# Patient Record
Sex: Male | Born: 1950 | Race: White | Hispanic: No | Marital: Married | Smoking: Never smoker
Health system: Southern US, Community
[De-identification: ages and names within clinical notes are randomized; demographics above are authoritative.]

## PROBLEM LIST (undated history)

## (undated) DIAGNOSIS — K311 Adult hypertrophic pyloric stenosis: Secondary | ICD-10-CM

## (undated) DIAGNOSIS — H348192 Central retinal vein occlusion, unspecified eye, stable: Secondary | ICD-10-CM

## (undated) DIAGNOSIS — K6389 Other specified diseases of intestine: Secondary | ICD-10-CM

## (undated) DIAGNOSIS — K5790 Diverticulosis of intestine, part unspecified, without perforation or abscess without bleeding: Secondary | ICD-10-CM

## (undated) DIAGNOSIS — K4090 Unilateral inguinal hernia, without obstruction or gangrene, not specified as recurrent: Secondary | ICD-10-CM

## (undated) HISTORY — PX: WISDOM TOOTH EXTRACTION: SHX21

## (undated) HISTORY — PX: HERNIA REPAIR: SHX51

## (undated) HISTORY — PX: ABDOMINAL SURGERY: SHX537

## (undated) HISTORY — PX: COLONOSCOPY: SHX174

## (undated) HISTORY — PX: TONSILLECTOMY: SUR1361

---

## 2014-10-03 ENCOUNTER — Emergency Department (HOSPITAL_COMMUNITY)
Admission: EM | Admit: 2014-10-03 | Discharge: 2014-10-03 | Disposition: A | Discharge status: Home / Self Care | Attending: Emergency Medicine | Admitting: Emergency Medicine

## 2014-10-03 ENCOUNTER — Encounter (HOSPITAL_COMMUNITY): Payer: Self-pay | Admitting: Emergency Medicine

## 2014-10-03 DIAGNOSIS — S0181XA Laceration without foreign body of other part of head, initial encounter: Secondary | ICD-10-CM

## 2014-10-03 DIAGNOSIS — Y998 Other external cause status: Secondary | ICD-10-CM | POA: Insufficient documentation

## 2014-10-03 DIAGNOSIS — X58XXXA Exposure to other specified factors, initial encounter: Secondary | ICD-10-CM | POA: Insufficient documentation

## 2014-10-03 DIAGNOSIS — Z8719 Personal history of other diseases of the digestive system: Secondary | ICD-10-CM | POA: Insufficient documentation

## 2014-10-03 DIAGNOSIS — Y9389 Activity, other specified: Secondary | ICD-10-CM | POA: Insufficient documentation

## 2014-10-03 DIAGNOSIS — Z23 Encounter for immunization: Secondary | ICD-10-CM | POA: Diagnosis not present

## 2014-10-03 DIAGNOSIS — Y9289 Other specified places as the place of occurrence of the external cause: Secondary | ICD-10-CM | POA: Diagnosis not present

## 2014-10-03 DIAGNOSIS — S0990XA Unspecified injury of head, initial encounter: Secondary | ICD-10-CM | POA: Diagnosis present

## 2014-10-03 HISTORY — DX: Adult hypertrophic pyloric stenosis: K31.1

## 2014-10-03 MED ORDER — BACITRACIN ZINC 500 UNIT/GM EX OINT
TOPICAL_OINTMENT | CUTANEOUS | Status: AC
  Filled 2014-10-03: qty 0.9

## 2014-10-03 MED ORDER — LIDOCAINE-EPINEPHRINE 1 %-1:100000 IJ SOLN
INTRAMUSCULAR | Status: AC
  Administered 2014-10-03: 1 mL
  Filled 2014-10-03: qty 1

## 2014-10-03 MED ORDER — TETANUS-DIPHTH-ACELL PERTUSSIS 5-2.5-18.5 LF-MCG/0.5 IM SUSP
0.5000 mL | Freq: Once | INTRAMUSCULAR | Status: DC
  Filled 2014-10-03: qty 0.5

## 2014-10-03 NOTE — ED Provider Notes (Signed)
CSN: [id number]     Arrival date & time 10/03/14  9563 History   First MD Initiated Contact with Patient 10/03/14 [phone]     Chief Complaint  Patient presents with  . Head Injury     (Consider location/radiation/quality/duration/timing/severity/associated sxs/prior Treatment) HPI Comments: Patient is a 64 year old male with no significant past medical history. He presents with complaints of forehead laceration. He states that he bent over to pick something up off the floor and at the same time his dog jumped upward and they clashed heads. He sustained a laceration to the forehead just between his eyes. He denies any loss of consciousness, nausea, vomiting, or visual disturbances.  Patient is a 64 y.o. male presenting with head injury. The history is provided by the patient.  Head Injury Head/neck injury location: Forehead. Time since incident:  1 hour Mechanism of injury: direct blow   Pain details:    Quality:  Dull   Duration:  1 hour   Timing:  Constant   Progression:  Unchanged Chronicity:  New Relieved by:  Nothing Worsened by:  Nothing tried Ineffective treatments:  None tried   Past Medical History  Diagnosis Date  . Pyloric stenosis    Past Surgical History  Procedure Laterality Date  . Hernia repair     History reviewed. No pertinent family history. History  Substance Use Topics  . Smoking status: Never Smoker   . Smokeless tobacco: Not on file  . Alcohol Use: 0.6 oz/week    1 Glasses of wine per week     Comment: daily    Review of Systems  All other systems reviewed and are negative.     Allergies  Review of patient's allergies indicates no known allergies.  Home Medications   Prior to Admission medications   Not on File   BP 175/102 mmHg  Pulse 67  Temp(Src) 97.4 F (36.3 C) (Oral)  Resp 16  SpO2 97% Physical Exam  Constitutional: He is oriented to person, place, and time. He appears well-developed and well-nourished.  HENT:  Head:  Normocephalic.  TMs are clear bilaterally.  There is a 1.5 cm laceration oriented diagonally to the forehead between the eyes.  Eyes: EOM are normal. Pupils are equal, round, and reactive to light.  There is no diplopia with upward gaze.  Neck: Normal range of motion. Neck supple.  There is no cervical spine tenderness and he has painless range of motion in all directions.  Lymphadenopathy:    He has no cervical adenopathy.  Neurological: He is alert and oriented to person, place, and time. No cranial nerve deficit. Coordination normal.  Skin: He is not diaphoretic.  Nursing note and vitals reviewed.   ED Course  Procedures (including critical care time) Labs Review Labs Reviewed - No data to display  Imaging Review No results found.   EKG Interpretation None     LACERATION REPAIR Performed by: Veryl Speak Authorized by: Veryl Speak Consent: Verbal consent obtained. Risks and benefits: risks, benefits and alternatives were discussed Consent given by: patient Patient identity confirmed: provided demographic data Prepped and Draped in normal sterile fashion Wound explored  Laceration Location: Forehead  Laceration Length: 1.5 cm  No Foreign Bodies seen or palpated  Anesthesia: local infiltration  Local anesthetic: lidocaine 1% with epinephrine  Anesthetic total: 2 ml  Irrigation method: syringe Amount of cleaning: standard  Skin closure: 6-0 Prolene   Number of sutures: 3   Technique: Simple interrupted   Patient tolerance: Patient tolerated the  procedure well with no immediate complications.   MDM   Final diagnoses:  None    Patient presents with complaints of a forehead laceration after bumping heads with a dog. He is neurologically intact and there was no loss of consciousness. His laceration was repaired and I do not feel as though any imaging studies are warranted. He will be discharged to home with local wound care and suture removal in 5-6  days.    Veryl Speak, MD 10/03/14 [phone]

## 2014-10-03 NOTE — Discharge Instructions (Signed)
Local wound care with bacitracin and dressing changes twice daily.  Sutures are to be removed in 5-6 days, return sooner if the wound develops redness, pus draining from the wound, or other new and concerning symptoms.   Facial Laceration  A facial laceration is a cut on the face. These injuries can be painful and cause bleeding. Lacerations usually heal quickly, but they need special care to reduce scarring. DIAGNOSIS  Your health care provider will take a medical history, ask for details about how the injury occurred, and examine the wound to determine how deep the cut is. TREATMENT  Some facial lacerations may not require closure. Others may not be able to be closed because of an increased risk of infection. The risk of infection and the chance for successful closure will depend on various factors, including the amount of time since the injury occurred. The wound may be cleaned to help prevent infection. If closure is appropriate, pain medicines may be given if needed. Your health care provider will use stitches (sutures), wound glue (adhesive), or skin adhesive strips to repair the laceration. These tools bring the skin edges together to allow for faster healing and a better cosmetic outcome. If needed, you may also be given a tetanus shot. HOME CARE INSTRUCTIONS  Only take over-the-counter or prescription medicines as directed by your health care provider.  Follow your health care provider's instructions for wound care. These instructions will vary depending on the technique used for closing the wound. For Sutures:  Keep the wound clean and dry.   If you were given a bandage (dressing), you should change it at least once a day. Also change the dressing if it becomes wet or dirty, or as directed by your health care provider.   Wash the wound with soap and water 2 times a day. Rinse the wound off with water to remove all soap. Pat the wound dry with a clean towel.   After cleaning,  apply a thin layer of the antibiotic ointment recommended by your health care provider. This will help prevent infection and keep the dressing from sticking.   You may shower as usual after the first 24 hours. Do not soak the wound in water until the sutures are removed.   Get your sutures removed as directed by your health care provider. With facial lacerations, sutures should usually be taken out after 4-5 days to avoid stitch marks.   Wait a few days after your sutures are removed before applying any makeup. For Skin Adhesive Strips:  Keep the wound clean and dry.   Do not get the skin adhesive strips wet. You may bathe carefully, using caution to keep the wound dry.   If the wound gets wet, pat it dry with a clean towel.   Skin adhesive strips will fall off on their own. You may trim the strips as the wound heals. Do not remove skin adhesive strips that are still stuck to the wound. They will fall off in time.  For Wound Adhesive:  You may briefly wet your wound in the shower or bath. Do not soak or scrub the wound. Do not swim. Avoid periods of heavy sweating until the skin adhesive has fallen off on its own. After showering or bathing, gently pat the wound dry with a clean towel.   Do not apply liquid medicine, cream medicine, ointment medicine, or makeup to your wound while the skin adhesive is in place. This may loosen the film before your wound is  healed.   If a dressing is placed over the wound, be careful not to apply tape directly over the skin adhesive. This may cause the adhesive to be pulled off before the wound is healed.   Avoid prolonged exposure to sunlight or tanning lamps while the skin adhesive is in place.  The skin adhesive will usually remain in place for 5-10 days, then naturally fall off the skin. Do not pick at the adhesive film.  After Healing: Once the wound has healed, cover the wound with sunscreen during the day for 1 full year. This can help  minimize scarring. Exposure to ultraviolet light in the first year will darken the scar. It can take 1-2 years for the scar to lose its redness and to heal completely.  SEEK IMMEDIATE MEDICAL CARE IF:  You have redness, pain, or swelling around the wound.   You see ayellowish-white fluid (pus) coming from the wound.   You have chills or a fever.  MAKE SURE YOU:  Understand these instructions.  Will watch your condition.  Will get help right away if you are not doing well or get worse. Document Released: 10/23/2004 Document Revised: 07/06/2013 Document Reviewed: 04/28/2013 [facility] Patient Information 2015 [place], [place]. This information is not intended to replace advice given to you by your health care provider. Make sure you discuss any questions you have with your health care provider.

## 2014-10-03 NOTE — ED Notes (Signed)
Pt states he was reaching over to pick something off the floor and his german shepard was going to jump up on the bed and they hit heads  Pt has a laceration noted to his forehead  Bleeding controlled

## 2017-04-02 DIAGNOSIS — Z8371 Family history of colonic polyps: Secondary | ICD-10-CM | POA: Diagnosis not present

## 2017-04-02 DIAGNOSIS — Z800 Family history of malignant neoplasm of digestive organs: Secondary | ICD-10-CM | POA: Diagnosis not present

## 2017-04-02 DIAGNOSIS — K625 Hemorrhage of anus and rectum: Secondary | ICD-10-CM | POA: Diagnosis not present

## 2017-04-02 DIAGNOSIS — Z1211 Encounter for screening for malignant neoplasm of colon: Secondary | ICD-10-CM | POA: Diagnosis not present

## 2017-04-02 DIAGNOSIS — K5901 Slow transit constipation: Secondary | ICD-10-CM | POA: Diagnosis not present

## 2017-04-16 DIAGNOSIS — H34812 Central retinal vein occlusion, left eye, with macular edema: Secondary | ICD-10-CM | POA: Diagnosis not present

## 2017-04-16 DIAGNOSIS — H269 Unspecified cataract: Secondary | ICD-10-CM | POA: Diagnosis not present

## 2017-04-20 ENCOUNTER — Other Ambulatory Visit: Payer: Self-pay | Admitting: Gastroenterology

## 2017-04-20 DIAGNOSIS — Z1211 Encounter for screening for malignant neoplasm of colon: Secondary | ICD-10-CM | POA: Diagnosis not present

## 2017-04-20 DIAGNOSIS — D124 Benign neoplasm of descending colon: Secondary | ICD-10-CM | POA: Diagnosis not present

## 2017-04-20 DIAGNOSIS — Z800 Family history of malignant neoplasm of digestive organs: Secondary | ICD-10-CM | POA: Diagnosis not present

## 2017-04-20 DIAGNOSIS — K6389 Other specified diseases of intestine: Secondary | ICD-10-CM

## 2017-04-20 DIAGNOSIS — D126 Benign neoplasm of colon, unspecified: Secondary | ICD-10-CM | POA: Diagnosis not present

## 2017-04-20 DIAGNOSIS — D127 Benign neoplasm of rectosigmoid junction: Secondary | ICD-10-CM | POA: Diagnosis not present

## 2017-04-20 DIAGNOSIS — R933 Abnormal findings on diagnostic imaging of other parts of digestive tract: Secondary | ICD-10-CM

## 2017-04-20 DIAGNOSIS — D122 Benign neoplasm of ascending colon: Secondary | ICD-10-CM | POA: Diagnosis not present

## 2017-04-20 DIAGNOSIS — K635 Polyp of colon: Secondary | ICD-10-CM | POA: Diagnosis not present

## 2017-04-20 NOTE — Progress Notes (Signed)
Douglas Preiss MD 

## 2017-04-21 DIAGNOSIS — R972 Elevated prostate specific antigen [PSA]: Secondary | ICD-10-CM | POA: Diagnosis not present

## 2017-04-21 DIAGNOSIS — K639 Disease of intestine, unspecified: Secondary | ICD-10-CM | POA: Diagnosis not present

## 2017-04-21 DIAGNOSIS — N400 Enlarged prostate without lower urinary tract symptoms: Secondary | ICD-10-CM | POA: Diagnosis not present

## 2017-04-22 ENCOUNTER — Ambulatory Visit
Admission: RE | Admit: 2017-04-22 | Discharge: 2017-04-22 | Disposition: A | Discharge status: Home / Self Care | Payer: Medicare Other | Source: Ambulatory Visit | Attending: Gastroenterology | Admitting: Gastroenterology

## 2017-04-22 DIAGNOSIS — R918 Other nonspecific abnormal finding of lung field: Secondary | ICD-10-CM | POA: Diagnosis not present

## 2017-04-22 DIAGNOSIS — R933 Abnormal findings on diagnostic imaging of other parts of digestive tract: Secondary | ICD-10-CM

## 2017-04-22 DIAGNOSIS — K6389 Other specified diseases of intestine: Secondary | ICD-10-CM

## 2017-04-22 DIAGNOSIS — K5900 Constipation, unspecified: Secondary | ICD-10-CM | POA: Diagnosis not present

## 2017-04-22 MED ORDER — IOPAMIDOL (ISOVUE-300) INJECTION 61%
100.0000 mL | Freq: Once | INTRAVENOUS | Status: AC | PRN
  Administered 2017-04-22: 100 mL via INTRAVENOUS

## 2017-05-08 ENCOUNTER — Ambulatory Visit: Payer: Self-pay | Admitting: General Surgery

## 2017-05-08 DIAGNOSIS — D126 Benign neoplasm of colon, unspecified: Secondary | ICD-10-CM | POA: Diagnosis not present

## 2017-05-11 ENCOUNTER — Encounter (HOSPITAL_COMMUNITY): Payer: Self-pay | Admitting: *Deleted

## 2017-05-11 MED ORDER — ALVIMOPAN 12 MG PO CAPS
12.0000 mg | ORAL_CAPSULE | ORAL | Status: AC
  Administered 2017-05-12: 12 mg via ORAL
  Filled 2017-05-11: qty 1

## 2017-05-11 MED ORDER — CEFOTETAN DISODIUM-DEXTROSE 2-2.08 GM-% IV SOLR
2.0000 g | INTRAVENOUS | Status: AC
  Administered 2017-05-12: 2 g via INTRAVENOUS
  Filled 2017-05-11: qty 50

## 2017-05-11 NOTE — Progress Notes (Signed)
SDW-Pre -op call completed by both pt and pt spouse, [place], via speaker phone with pt consent. Pt denies SOB, chest pain, and being under the care of a cardiologist. Pt denies having a cardiac cath and echo. Pt denies having an EKG within the last year. Pt denies recent labs. Pt made aware to drink plenty of clear liquids with prep to avoid dehydration (per MD). Pt made aware to stop taking  Aspirin, vitamins, fish oil and herbal medications. Do not take any NSAIDs ie: Ibuprofen, Advil, Naproxen (Aleve), Motrin, BC and Goody Powder or any medication containing Aspirin. Pt verbalized understanding of all pre-op instructions. Anesthesia asked to review pt history.

## 2017-05-12 ENCOUNTER — Inpatient Hospital Stay (HOSPITAL_COMMUNITY): Payer: Medicare Other

## 2017-05-12 ENCOUNTER — Inpatient Hospital Stay (HOSPITAL_COMMUNITY): Payer: Medicare Other | Admitting: Emergency Medicine

## 2017-05-12 ENCOUNTER — Inpatient Hospital Stay (HOSPITAL_COMMUNITY)
Admission: RE | Admit: 2017-05-12 | Discharge: 2017-05-20 | DRG: 330 | Disposition: A | Discharge status: Home / Self Care | Payer: Medicare Other | Source: Ambulatory Visit | Attending: General Surgery | Admitting: General Surgery

## 2017-05-12 ENCOUNTER — Encounter (HOSPITAL_COMMUNITY)
Admission: RE | Disposition: A | Discharge status: Home / Self Care | Payer: Self-pay | Source: Ambulatory Visit | Attending: General Surgery

## 2017-05-12 ENCOUNTER — Encounter (HOSPITAL_COMMUNITY): Payer: Self-pay | Admitting: Urology

## 2017-05-12 DIAGNOSIS — D123 Benign neoplasm of transverse colon: Principal | ICD-10-CM | POA: Diagnosis present

## 2017-05-12 DIAGNOSIS — R140 Abdominal distension (gaseous): Secondary | ICD-10-CM

## 2017-05-12 DIAGNOSIS — N400 Enlarged prostate without lower urinary tract symptoms: Secondary | ICD-10-CM | POA: Diagnosis present

## 2017-05-12 DIAGNOSIS — I973 Postprocedural hypertension: Secondary | ICD-10-CM | POA: Diagnosis not present

## 2017-05-12 DIAGNOSIS — D127 Benign neoplasm of rectosigmoid junction: Secondary | ICD-10-CM | POA: Diagnosis present

## 2017-05-12 DIAGNOSIS — D128 Benign neoplasm of rectum: Secondary | ICD-10-CM | POA: Diagnosis not present

## 2017-05-12 DIAGNOSIS — Z86010 Personal history of colonic polyps: Secondary | ICD-10-CM | POA: Diagnosis not present

## 2017-05-12 DIAGNOSIS — K4090 Unilateral inguinal hernia, without obstruction or gangrene, not specified as recurrent: Secondary | ICD-10-CM | POA: Diagnosis not present

## 2017-05-12 DIAGNOSIS — K567 Ileus, unspecified: Secondary | ICD-10-CM | POA: Diagnosis not present

## 2017-05-12 DIAGNOSIS — C188 Malignant neoplasm of overlapping sites of colon: Secondary | ICD-10-CM | POA: Diagnosis not present

## 2017-05-12 DIAGNOSIS — C20 Malignant neoplasm of rectum: Secondary | ICD-10-CM | POA: Diagnosis not present

## 2017-05-12 DIAGNOSIS — R1900 Intra-abdominal and pelvic swelling, mass and lump, unspecified site: Secondary | ICD-10-CM | POA: Diagnosis not present

## 2017-05-12 DIAGNOSIS — D126 Benign neoplasm of colon, unspecified: Secondary | ICD-10-CM | POA: Diagnosis present

## 2017-05-12 DIAGNOSIS — Z03818 Encounter for observation for suspected exposure to other biological agents ruled out: Secondary | ICD-10-CM | POA: Diagnosis not present

## 2017-05-12 DIAGNOSIS — C19 Malignant neoplasm of rectosigmoid junction: Secondary | ICD-10-CM | POA: Diagnosis not present

## 2017-05-12 DIAGNOSIS — K6389 Other specified diseases of intestine: Secondary | ICD-10-CM | POA: Diagnosis not present

## 2017-05-12 DIAGNOSIS — Z01818 Encounter for other preprocedural examination: Secondary | ICD-10-CM

## 2017-05-12 HISTORY — DX: Diverticulosis of intestine, part unspecified, without perforation or abscess without bleeding: K57.90

## 2017-05-12 HISTORY — PX: PARTIAL COLECTOMY: SHX5273

## 2017-05-12 HISTORY — DX: Other specified diseases of intestine: K63.89

## 2017-05-12 HISTORY — DX: Central retinal vein occlusion, unspecified eye, stable: H34.8192

## 2017-05-12 HISTORY — PX: PROCTOSCOPY: SHX2266

## 2017-05-12 HISTORY — PX: COLONOSCOPY: SHX5424

## 2017-05-12 HISTORY — DX: Unilateral inguinal hernia, without obstruction or gangrene, not specified as recurrent: K40.90

## 2017-05-12 LAB — COMPREHENSIVE METABOLIC PANEL
ALT: 34 U/L (ref 17–63)
AST: 36 U/L (ref 15–41)
Albumin: 3.8 g/dL (ref 3.5–5.0)
Alkaline Phosphatase: 57 U/L (ref 38–126)
Anion gap: 9 (ref 5–15)
BUN: <5 mg/dL — ABNORMAL LOW (ref 6–20)
CHLORIDE: 107 mmol/L (ref 101–111)
CO2: 25 mmol/L (ref 22–32)
Calcium: 9.1 mg/dL (ref 8.9–10.3)
Creatinine, Ser: 0.83 mg/dL (ref 0.61–1.24)
GFR calc Af Amer: >60 mL/min (ref >60)
GFR calc non Af Amer: >60 mL/min (ref >60)
GLUCOSE: 85 mg/dL (ref 65–99)
POTASSIUM: 3.8 mmol/L (ref 3.5–5.1)
SODIUM: 141 mmol/L (ref 135–145)
Total Bilirubin: 0.7 mg/dL (ref 0.3–1.2)
Total Protein: 6.7 g/dL (ref 6.5–8.1)

## 2017-05-12 LAB — CBC WITH DIFFERENTIAL/PLATELET
BASOS ABS: 0 10*3/uL (ref 0.0–0.1)
Basophils Relative: 0 %
EOS ABS: 0 10*3/uL (ref 0.0–0.7)
Eosinophils Relative: 1 %
HCT: 42.2 % (ref 39.0–52.0)
Hemoglobin: 14.1 g/dL (ref 13.0–17.0)
Lymphocytes Relative: 29 %
Lymphs Abs: 1.1 10*3/uL (ref 0.7–4.0)
MCH: 26 pg (ref 26.0–34.0)
MCHC: 33.4 g/dL (ref 30.0–36.0)
MCV: 77.9 fL — ABNORMAL LOW (ref 78.0–100.0)
MONO ABS: 0.2 10*3/uL (ref 0.1–1.0)
Monocytes Relative: 6 %
NEUTROS ABS: 2.4 10*3/uL (ref 1.7–7.7)
Neutrophils Relative %: 64 %
PLATELETS: 229 10*3/uL (ref 150–400)
RBC: 5.42 MIL/uL (ref 4.22–5.81)
RDW: 13.5 % (ref 11.5–15.5)
WBC: 3.8 10*3/uL — ABNORMAL LOW (ref 4.0–10.5)

## 2017-05-12 LAB — TYPE AND SCREEN
ABO/RH(D): O POS
ANTIBODY SCREEN: NEGATIVE

## 2017-05-12 LAB — ABO/RH: ABO/RH(D): O POS

## 2017-05-12 SURGERY — COLECTOMY, PARTIAL
Anesthesia: General | Site: Rectum

## 2017-05-12 MED ORDER — HYDROMORPHONE HCL 1 MG/ML IJ SOLN
0.2500 mg | INTRAMUSCULAR | Status: DC | PRN
  Administered 2017-05-12: 1 mg via INTRAVENOUS

## 2017-05-12 MED ORDER — ALBUMIN HUMAN 5 % IV SOLN
INTRAVENOUS | Status: DC | PRN
  Administered 2017-05-12 (×2): via INTRAVENOUS

## 2017-05-12 MED ORDER — FENTANYL CITRATE (PF) 100 MCG/2ML IJ SOLN
INTRAMUSCULAR | Status: DC | PRN
  Administered 2017-05-12 (×2): 50 ug via INTRAVENOUS
  Administered 2017-05-12: 200 ug via INTRAVENOUS
  Administered 2017-05-12 (×4): 50 ug via INTRAVENOUS
  Administered 2017-05-12 (×2): 100 ug via INTRAVENOUS
  Administered 2017-05-12: 50 ug via INTRAVENOUS

## 2017-05-12 MED ORDER — SODIUM CHLORIDE 0.9% FLUSH: 9.0000 mL | INTRAVENOUS | Status: DC | PRN

## 2017-05-12 MED ORDER — DEXTROSE 5 % IV SOLN
2.0000 g | Freq: Two times a day (BID) | INTRAVENOUS | Status: AC
  Administered 2017-05-13: 2 g via INTRAVENOUS
  Filled 2017-05-12: qty 2

## 2017-05-12 MED ORDER — FENTANYL CITRATE (PF) 250 MCG/5ML IJ SOLN
INTRAMUSCULAR | Status: AC
  Filled 2017-05-12: qty 5

## 2017-05-12 MED ORDER — DEXAMETHASONE SODIUM PHOSPHATE 10 MG/ML IJ SOLN
INTRAMUSCULAR | Status: AC
  Filled 2017-05-12: qty 1

## 2017-05-12 MED ORDER — ONDANSETRON HCL 4 MG/2ML IJ SOLN: 4.0000 mg | Freq: Four times a day (QID) | INTRAMUSCULAR | Status: DC | PRN

## 2017-05-12 MED ORDER — LACTATED RINGERS IV SOLN
INTRAVENOUS | Status: DC
  Administered 2017-05-12 (×4): via INTRAVENOUS

## 2017-05-12 MED ORDER — ROCURONIUM BROMIDE 10 MG/ML (PF) SYRINGE
PREFILLED_SYRINGE | INTRAVENOUS | Status: AC
  Filled 2017-05-12: qty 5

## 2017-05-12 MED ORDER — DIPHENHYDRAMINE HCL 12.5 MG/5ML PO ELIX: 12.5000 mg | ORAL_SOLUTION | Freq: Four times a day (QID) | ORAL | Status: DC | PRN

## 2017-05-12 MED ORDER — PROMETHAZINE HCL 25 MG/ML IJ SOLN: 6.2500 mg | INTRAMUSCULAR | Status: DC | PRN

## 2017-05-12 MED ORDER — MIDAZOLAM HCL 5 MG/5ML IJ SOLN
INTRAMUSCULAR | Status: DC | PRN
  Administered 2017-05-12: 1 mg via INTRAVENOUS

## 2017-05-12 MED ORDER — HYDROMORPHONE HCL 1 MG/ML IJ SOLN
INTRAMUSCULAR | Status: AC
  Administered 2017-05-12: 1 mg
  Filled 2017-05-12: qty 1

## 2017-05-12 MED ORDER — CHLORHEXIDINE GLUCONATE CLOTH 2 % EX PADS: 6.0000 | MEDICATED_PAD | Freq: Once | CUTANEOUS | Status: DC

## 2017-05-12 MED ORDER — ENOXAPARIN SODIUM 40 MG/0.4ML ~~LOC~~ SOLN
40.0000 mg | SUBCUTANEOUS | Status: DC
  Administered 2017-05-13 – 2017-05-19 (×7): 40 mg via SUBCUTANEOUS
  Filled 2017-05-12 (×8): qty 0.4

## 2017-05-12 MED ORDER — LIDOCAINE 2% (20 MG/ML) 5 ML SYRINGE
INTRAMUSCULAR | Status: AC
  Filled 2017-05-12: qty 5

## 2017-05-12 MED ORDER — KCL IN DEXTROSE-NACL 20-5-0.45 MEQ/L-%-% IV SOLN
INTRAVENOUS | Status: DC
  Administered 2017-05-12 – 2017-05-18 (×8): via INTRAVENOUS
  Filled 2017-05-12 (×14): qty 1000

## 2017-05-12 MED ORDER — POVIDONE-IODINE 10 % EX OINT
TOPICAL_OINTMENT | CUTANEOUS | Status: AC
  Filled 2017-05-12: qty 28.35

## 2017-05-12 MED ORDER — GABAPENTIN 300 MG PO CAPS
300.0000 mg | ORAL_CAPSULE | Freq: Two times a day (BID) | ORAL | Status: DC
  Administered 2017-05-12 – 2017-05-20 (×15): 300 mg via ORAL
  Filled 2017-05-12 (×15): qty 1

## 2017-05-12 MED ORDER — ACETAMINOPHEN 500 MG PO TABS
1000.0000 mg | ORAL_TABLET | Freq: Four times a day (QID) | ORAL | Status: AC
  Administered 2017-05-12 – 2017-05-13 (×4): 1000 mg via ORAL
  Filled 2017-05-12 (×4): qty 2

## 2017-05-12 MED ORDER — PHENYLEPHRINE HCL 10 MG/ML IJ SOLN
INTRAVENOUS | Status: DC | PRN
  Administered 2017-05-12: 30 ug/min via INTRAVENOUS

## 2017-05-12 MED ORDER — MIDAZOLAM HCL 2 MG/2ML IJ SOLN
INTRAMUSCULAR | Status: AC
  Filled 2017-05-12: qty 2

## 2017-05-12 MED ORDER — ONDANSETRON HCL 4 MG/2ML IJ SOLN
INTRAMUSCULAR | Status: AC
  Filled 2017-05-12: qty 2

## 2017-05-12 MED ORDER — ALVIMOPAN 12 MG PO CAPS
12.0000 mg | ORAL_CAPSULE | Freq: Two times a day (BID) | ORAL | Status: DC
  Administered 2017-05-13 – 2017-05-14 (×3): 12 mg via ORAL
  Filled 2017-05-12 (×3): qty 1

## 2017-05-12 MED ORDER — 0.9 % SODIUM CHLORIDE (POUR BTL) OPTIME
TOPICAL | Status: DC | PRN
  Administered 2017-05-12 (×5): 1000 mL

## 2017-05-12 MED ORDER — HYDROMORPHONE 1 MG/ML IV SOLN
INTRAVENOUS | Status: AC
  Filled 2017-05-12: qty 25

## 2017-05-12 MED ORDER — PROPOFOL 10 MG/ML IV BOLUS
INTRAVENOUS | Status: AC
  Filled 2017-05-12: qty 20

## 2017-05-12 MED ORDER — SUGAMMADEX SODIUM 200 MG/2ML IV SOLN
INTRAVENOUS | Status: DC | PRN
  Administered 2017-05-12: 170 mg via INTRAVENOUS

## 2017-05-12 MED ORDER — ROCURONIUM BROMIDE 100 MG/10ML IV SOLN
INTRAVENOUS | Status: DC | PRN
  Administered 2017-05-12 (×2): 50 mg via INTRAVENOUS
  Administered 2017-05-12: 20 mg via INTRAVENOUS
  Administered 2017-05-12: 50 mg via INTRAVENOUS

## 2017-05-12 MED ORDER — PHENYLEPHRINE HCL 10 MG/ML IJ SOLN
INTRAMUSCULAR | Status: DC | PRN
  Administered 2017-05-12: 120 ug via INTRAVENOUS

## 2017-05-12 MED ORDER — LIDOCAINE HCL (CARDIAC) 20 MG/ML IV SOLN
INTRAVENOUS | Status: DC | PRN
  Administered 2017-05-12: 60 mg via INTRAVENOUS

## 2017-05-12 MED ORDER — NALOXONE HCL 0.4 MG/ML IJ SOLN: 0.4000 mg | INTRAMUSCULAR | Status: DC | PRN

## 2017-05-12 MED ORDER — SUGAMMADEX SODIUM 200 MG/2ML IV SOLN
INTRAVENOUS | Status: AC
  Filled 2017-05-12: qty 2

## 2017-05-12 MED ORDER — PROPOFOL 10 MG/ML IV BOLUS
INTRAVENOUS | Status: DC | PRN
  Administered 2017-05-12: 200 mg via INTRAVENOUS

## 2017-05-12 MED ORDER — DEXAMETHASONE SODIUM PHOSPHATE 10 MG/ML IJ SOLN
INTRAMUSCULAR | Status: DC | PRN
  Administered 2017-05-12: 10 mg via INTRAVENOUS

## 2017-05-12 MED ORDER — POVIDONE-IODINE 10 % EX OINT
TOPICAL_OINTMENT | CUTANEOUS | Status: DC | PRN
  Administered 2017-05-12: 1 via TOPICAL

## 2017-05-12 MED ORDER — HYDROMORPHONE 1 MG/ML IV SOLN
INTRAVENOUS | Status: DC
  Administered 2017-05-12: 2.9 mg via INTRAVENOUS
  Administered 2017-05-12: 18:00:00 via INTRAVENOUS
  Administered 2017-05-13: 0.9 mg via INTRAVENOUS
  Administered 2017-05-13: 0.6 mg via INTRAVENOUS

## 2017-05-12 MED ORDER — HYDROMORPHONE HCL 1 MG/ML IJ SOLN
INTRAMUSCULAR | Status: AC
  Filled 2017-05-12: qty 1

## 2017-05-12 MED ORDER — DIPHENHYDRAMINE HCL 50 MG/ML IJ SOLN: 12.5000 mg | Freq: Four times a day (QID) | INTRAMUSCULAR | Status: DC | PRN

## 2017-05-12 SURGICAL SUPPLY — 66 items
BLADE CLIPPER SURG (BLADE) ×10 IMPLANT
BLADE SURG 10 STRL SS (BLADE) ×5 IMPLANT
CANISTER SUCT 3000ML PPV (MISCELLANEOUS) ×5 IMPLANT
CHLORAPREP W/TINT 26ML (MISCELLANEOUS) ×5 IMPLANT
COVER MAYO STAND STRL (DRAPES) ×10 IMPLANT
COVER SURGICAL LIGHT HANDLE (MISCELLANEOUS) ×10 IMPLANT
DRAPE HALF SHEET 40X57 (DRAPES) ×10 IMPLANT
DRAPE LAPAROSCOPIC ABDOMINAL (DRAPES) ×5 IMPLANT
DRAPE UTILITY XL STRL (DRAPES) ×5 IMPLANT
DRAPE WARM FLUID 44X44 (DRAPE) ×5 IMPLANT
DRSG OPSITE POSTOP 4X10 (GAUZE/BANDAGES/DRESSINGS) ×5 IMPLANT
DRSG OPSITE POSTOP 4X6 (GAUZE/BANDAGES/DRESSINGS) ×5 IMPLANT
ELECT BLADE 6.5 EXT (BLADE) ×5 IMPLANT
ELECT CAUTERY BLADE 6.4 (BLADE) ×10 IMPLANT
ELECT REM PT RETURN 9FT ADLT (ELECTROSURGICAL) ×5
ELECTRODE REM PT RTRN 9FT ADLT (ELECTROSURGICAL) ×3 IMPLANT
GLOVE BIO SURGEON STRL SZ7 (GLOVE) ×5 IMPLANT
GLOVE BIOGEL PI IND STRL 6.5 (GLOVE) ×3 IMPLANT
GLOVE BIOGEL PI IND STRL 7.0 (GLOVE) ×3 IMPLANT
GLOVE BIOGEL PI IND STRL 8 (GLOVE) ×9 IMPLANT
GLOVE BIOGEL PI IND STRL 8.5 (GLOVE) ×3 IMPLANT
GLOVE BIOGEL PI INDICATOR 6.5 (GLOVE) ×2
GLOVE BIOGEL PI INDICATOR 7.0 (GLOVE) ×2
GLOVE BIOGEL PI INDICATOR 8 (GLOVE) ×6
GLOVE BIOGEL PI INDICATOR 8.5 (GLOVE) ×2
GLOVE ECLIPSE 7.5 STRL STRAW (GLOVE) ×20 IMPLANT
GLOVE ECLIPSE 8.0 STRL XLNG CF (GLOVE) ×10 IMPLANT
GLOVE EUDERMIC 7 POWDERFREE (GLOVE) ×25 IMPLANT
GLOVE SURG SS PI 6.5 STRL IVOR (GLOVE) ×10 IMPLANT
GOWN STRL REUS W/ TWL LRG LVL3 (GOWN DISPOSABLE) ×15 IMPLANT
GOWN STRL REUS W/ TWL XL LVL3 (GOWN DISPOSABLE) ×18 IMPLANT
GOWN STRL REUS W/TWL LRG LVL3 (GOWN DISPOSABLE) ×10
GOWN STRL REUS W/TWL XL LVL3 (GOWN DISPOSABLE) ×12
KIT BASIN OR (CUSTOM PROCEDURE TRAY) ×5 IMPLANT
KIT ROOM TURNOVER OR (KITS) ×5 IMPLANT
LEGGING LITHOTOMY PAIR STRL (DRAPES) ×5 IMPLANT
LIGASURE IMPACT 36 18CM CVD LR (INSTRUMENTS) ×5 IMPLANT
NS IRRIG 1000ML POUR BTL (IV SOLUTION) ×10 IMPLANT
PACK GENERAL/GYN (CUSTOM PROCEDURE TRAY) ×5 IMPLANT
PAD ARMBOARD 7.5X6 YLW CONV (MISCELLANEOUS) ×5 IMPLANT
PENCIL BUTTON HOLSTER BLD 10FT (ELECTRODE) ×5 IMPLANT
RELOAD PROXIMATE 75MM BLUE (ENDOMECHANICALS) ×25 IMPLANT
SPECIMEN JAR X LARGE (MISCELLANEOUS) ×5 IMPLANT
SPONGE LAP 18X18 X RAY DECT (DISPOSABLE) ×20 IMPLANT
STAPLER CIRC ILS CVD 33MM 37CM (STAPLE) ×5 IMPLANT
STAPLER CUT CVD 40MM BLUE (STAPLE) ×5 IMPLANT
STAPLER GUN LINEAR PROX 60 (STAPLE) ×5 IMPLANT
STAPLER PROXIMATE 75MM BLUE (STAPLE) ×5 IMPLANT
STAPLER VISISTAT 35W (STAPLE) ×5 IMPLANT
SUCTION POOLE TIP (SUCTIONS) ×5 IMPLANT
SURGILUBE 2OZ TUBE FLIPTOP (MISCELLANEOUS) ×5 IMPLANT
SUT PDS AB 1 TP1 96 (SUTURE) ×10 IMPLANT
SUT PROLENE 2 0 CT2 30 (SUTURE) ×5 IMPLANT
SUT PROLENE 2 0 KS (SUTURE) IMPLANT
SUT SILK 2 0 SH CR/8 (SUTURE) ×5 IMPLANT
SUT SILK 2 0 TIES 10X30 (SUTURE) ×5 IMPLANT
SUT SILK 3 0 SH CR/8 (SUTURE) ×5 IMPLANT
SUT SILK 3 0 TIES 10X30 (SUTURE) ×5 IMPLANT
SYR BULB IRRIGATION 50ML (SYRINGE) ×5 IMPLANT
TOWEL OR 17X26 10 PK STRL BLUE (TOWEL DISPOSABLE) ×5 IMPLANT
TRAY FOLEY W/METER SILVER 14FR (SET/KITS/TRAYS/PACK) ×5 IMPLANT
TRAY PROCTOSCOPIC FIBER OPTIC (SET/KITS/TRAYS/PACK) ×10 IMPLANT
TUBE CONNECTING 12'X1/4 (SUCTIONS) ×2
TUBE CONNECTING 12X1/4 (SUCTIONS) ×8 IMPLANT
UNDERPAD 30X30 (UNDERPADS AND DIAPERS) ×5 IMPLANT
YANKAUER SUCT BULB TIP NO VENT (SUCTIONS) ×5 IMPLANT

## 2017-05-12 NOTE — Anesthesia Preprocedure Evaluation (Addendum)
Anesthesia Evaluation  Patient identified by MRN, date of birth, ID band Patient awake    Reviewed: Allergy & Precautions, NPO status , Patient's Chart, lab work & pertinent test results  Airway Mallampati: II  TM Distance: >3 FB Neck ROM: Full    Dental  (+) Dental Advisory Given   Pulmonary neg pulmonary ROS,    breath sounds clear to auscultation       Cardiovascular negative cardio ROS   Rhythm:Regular Rate:Normal     Neuro/Psych negative neurological ROS     GI/Hepatic Neg liver ROS, Colon mass   Endo/Other  negative endocrine ROS  Renal/GU negative Renal ROS     Musculoskeletal   Abdominal   Peds  Hematology negative hematology ROS (+)   Anesthesia Other Findings   Reproductive/Obstetrics                             Lab Results  Component Value Date   WBC 3.8 (L) 05/12/2017   HGB 14.1 05/12/2017   HCT 42.2 05/12/2017   MCV 77.9 (L) 05/12/2017   PLT 229 05/12/2017   Lab Results  Component Value Date   CREATININE 0.83 05/12/2017   BUN <5 (L) 05/12/2017   NA 141 05/12/2017   K 3.8 05/12/2017   CL 107 05/12/2017   CO2 25 05/12/2017    Anesthesia Physical Anesthesia Plan  ASA: II  Anesthesia Plan: General   Post-op Pain Management:    Induction: Intravenous  PONV Risk Score and Plan: 4 or greater and Ondansetron, Dexamethasone, Treatment may vary due to age or medical condition, Scopolamine patch - Pre-op and Midazolam  Airway Management Planned: Oral ETT  Additional Equipment:   Intra-op Plan:   Post-operative Plan: Extubation in OR  Informed Consent: I have reviewed the patients History and Physical, chart, labs and discussed the procedure including the risks, benefits and alternatives for the proposed anesthesia with the patient or authorized representative who has indicated his/her understanding and acceptance.   Dental advisory given  Plan Discussed  with: CRNA  Anesthesia Plan Comments:        Anesthesia Quick Evaluation

## 2017-05-12 NOTE — Anesthesia Procedure Notes (Signed)
Procedure Name: Intubation Date/Time: 05/12/2017 1:43 PM Performed by: Izora Gala Pre-anesthesia Checklist: Patient identified, Emergency Drugs available and Suction available Patient Re-evaluated:Patient Re-evaluated prior to induction Oxygen Delivery Method: Circle system utilized Preoxygenation: Pre-oxygenation with 100% oxygen Induction Type: IV induction Ventilation: Mask ventilation without difficulty Laryngoscope Size: Miller and 3 Grade View: Grade II Tube type: Oral Tube size: 7.5 mm Number of attempts: 1 Airway Equipment and Method: Stylet Placement Confirmation: ETT inserted through vocal cords under direct vision,  positive ETCO2 and breath sounds checked- equal and bilateral Secured at: 22 cm Tube secured with: Tape Dental Injury: Teeth and Oropharynx as per pre-operative assessment

## 2017-05-12 NOTE — Op Note (Signed)
OPERATIVE REPORT  DATE OF OPERATION: 05/12/2017  PATIENT:  Douglas Warren  66 y.o. 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  FINDINGS:  Tumor #1 in the proximal rectum at 13 cm from the anal verge; tumor #2 was in the proximal right transverse colon, just distal to the hepatic flexure.  No evidence of hepatic disease or metastatic spread.  Frozen section of the right colon lesion showed only evidence of a tubulovillous adenoma  PROCEDURE:  Procedure(s): LEFT COLECTOMY PARTIAL RIGHT TRANSVERSE COLECTOMY COLONOSCOPY PROCTOSCOPY  SURGEON:  Surgeon(s): Judeth Horn, MD Fanny Skates, MD Carol Ada, MD  ASSISTANT: Dalbert Batman (primary) and Dr. Benson Norway  ANESTHESIA:   general  COMPLICATIONS:  None  EBL: 100 ml  BLOOD ADMINISTERED: none  DRAINS: Urinary Catheter (Foley)   SPECIMEN:  Source of Specimen:  Descending colon and rectosigmoid 43 cm segment; 8 cm right transvers colon specimen with tumor #2.; extended proximal and distal margins fro tumor #2; proximal and distal anastomotic rings from EEA anastomosis  COUNTS CORRECT:  YES  PROCEDURE DETAILS: The patient was taken to the operating room and placed on the table initially in the supine position. After an adequate general endotracheal anesthetic was administered, he was placed in the lithotomy position, a proper timeout was performed identifying the patient and procedure to be performed, and an initial proctoscopy was performed.  The rigid sigmoidoscope was passed transanally into the patient's rectum and passed all the way to the hub of the scope a 22 cm.  At approximately 13  cm a friable tumor was seen which bled easily. We retrieved the proctoscope then subsequently prepped the patient's perineum and perirectal area, and also his abdomen in the usual sterile manner keeping him available for the subsequent transrectal anastomosis.  A second timeout was performed identifying the patient and procedure to be performed. A midline incision was made using a #10 blade and we started just above the umbilicus but it ended up extending proximally because of the need to take down the splenic flexure and identify the second primary tumor.  We entered the peritoneal cavity and then subsequently explored the abdomen manually and visually. The rectosigmoid tumor could be palpated in the proximal rectum. However, the second primary tumor could not be palpated within the descending colon on the left side. We mobilized the splenic flexure easily without injuring the spleen taking it all the way to the mid transverse colon and could not palpate a second tumor. We did palpate the cecum and the right colon and could not palpate a tumor in that area either. The reported tumor location was on the descending colon side so we went to the proximal descending colon, came across the colon with a GIA-75 stapler, then took down the colon all the way down to the rectum below the level of the palpable rectosigmoid tumor. Care was taken to identify and protect the left ureter, and this was done.  It was clearly identified crossing the left iliac artery and kept away from the area of resection.    The mesentery was taken with Kelly clamps and also a LigaSure device. We used the proximal portion of the colon and made a colotomy within a pursestring  suture, in order to pass a rigid sigmoidoscope proximally and distally and could not identify a second tumor up to the mid to right transverse colon. We also cannot find a tumor in the specimen that was taken off the field and subsequently open up. The only tumor that  was identified in the specimen was in the proximal rectum.  Because of these concerns and the patient having a known second primary tumor the gastroenterologist was requested to come colonoscope the patient through the open end of the colon in the operating room. This was done and a second tumor was identified just distal to the hepatic flexure on right side. This was marked with the colonoscope in place with a suture and subsequently resection of approximately 8 cm of colon done which included the primary tumor. We resected more margin proximally and distally just to make sure we had adequate margins and sent the specimen for frozen section which only identifiable was appeared to be a tubulovillous adenoma.  An anastomosis between the transverse colon and the hepatic flexure was done using a GIA-75 stapler. The expected enterotomy was closed using a TX 60 stapler. The only anastomosis left was between the proximal descending colon and the mobilized splenic flexure down to the rectum. This was performed using a premium 33 mm EEA stapler. The proximal descending colon had a pursestring placed around it which allowed the anvil to be placed and secured in place for the anastomosis. The surgeon went through the patient's rectum with the stapling device then attached to the anvil of the proximal descending colon. An anastomosis was performed by firing the stapling device. We subsequently inspected the anastomosis with a sigmoidoscope and also perform an air test with saline in the pelvis and those no evidence of leakage.  We placed a corner stitch on the proximal colon anastomosis where the right transverse colon was removed. The assistant then irrigated the pelvis and the abdomen with saline solution as the primary surgeon changed gowns and gloves for closure. Once this was done we inspected for adequate hemostasis and there was minimal bleeding. We closed the abdomen using running looped #1 PDS suture. The skin  was closed using a stainless steel staple. All needle counts, sponge counts, and instrument counts were correct. A sterile dressing was applied including Betadine ointment and a honeycomb dressing.  PATIENT DISPOSITION:  PACU - hemodynamically stable.   Maryori Weide 8/14/20185:42 PM

## 2017-05-12 NOTE — H&P (Signed)
[address] 05/08/2017 2:05 PM Location: [facility] Patient #: [id number] DOB: December 16, 1950 Married / Language: Cleophus Molt / Race: White Male   History of Present Illness Douglas Warren. Aleksi Brummet MD; 05/08/2017 2:41 PM) The patient is a 66 year old male who presents with a colonic mass. The patient was previously evaluated by a gastroenterologist (Dr. Collene Mares) 3 week(s) ago. Previous presentation included no symptoms (found incidentally with testing). The mass was discovered by colonoscopy. Past evaluation has included genetic evaluation (Cologuard). Past treatment has included none (Biopsies only. No attempt at resection colonoscopically). No changes in management were made at the last visit.   Past Surgical History Malachy Moan, [place]; 05/08/2017 2:05 PM) Colon Polyp Removal - Colonoscopy  Open Inguinal Hernia Surgery  Left. Oral Surgery  Tonsillectomy   Diagnostic Studies History Malachy Moan, [place]; 05/08/2017 2:05 PM) Colonoscopy  within last year  Allergies Malachy Moan, RMA; 05/08/2017 2:05 PM) No Known Allergies 05/08/2017  Medication History Malachy Moan, [place]; 05/08/2017 2:05 PM) No Current Medications Medications Reconciled  Social History Malachy Moan, [place]; 05/08/2017 2:05 PM) Alcohol use  Occasional alcohol use. Caffeine use  Coffee. No drug use  Tobacco use  Never smoker.  Family History Malachy Moan, [place]; 05/08/2017 2:05 PM) Arthritis  Mother. Cancer  Father. Colon Cancer  Sister. Colon Polyps  Sister. Prostate Cancer  Father. Respiratory Condition  Mother.  Other Problems Malachy Moan, [place]; 05/08/2017 2:05 PM) Enlarged Prostate  Hemorrhoids  Inguinal Hernia  Other disease, cancer, significant illness     Review of Systems Malachy Moan RMA; 05/08/2017 2:05 PM) General Present- Night Sweats. Not Present- Appetite Loss, Chills, Fatigue, Fever, Weight Gain and Weight Loss. Skin Not Present- Change in  Wart/Mole, Dryness, Hives, Jaundice, New Lesions, Non-Healing Wounds, Rash and Ulcer. HEENT Present- Visual Disturbances. Not Present- Earache, Hearing Loss, Hoarseness, Nose Bleed, Oral Ulcers, Ringing in the Ears, Seasonal Allergies, Sinus Pain, Sore Throat, Wears glasses/contact lenses and Yellow Eyes. Respiratory Not Present- Bloody sputum, Chronic Cough, Difficulty Breathing, Snoring and Wheezing. Breast Not Present- Breast Mass, Breast Pain, Nipple Discharge and Skin Changes. Cardiovascular Not Present- Chest Pain, Difficulty Breathing Lying Down, Leg Cramps, Palpitations, Rapid Heart Rate, Shortness of Breath and Swelling of Extremities. Gastrointestinal Present- Constipation and Hemorrhoids. Not Present- Abdominal Pain, Bloating, Bloody Stool, Change in Bowel Habits, Chronic diarrhea, Difficulty Swallowing, Excessive gas, Gets full quickly at meals, Indigestion, Nausea, Rectal Pain and Vomiting. Male Genitourinary Present- Impotence. Not Present- Blood in Urine, Change in Urinary Stream, Frequency, Nocturia, Painful Urination, Urgency and Urine Leakage. Musculoskeletal Not Present- Back Pain, Joint Pain, Joint Stiffness, Muscle Pain, Muscle Weakness and Swelling of Extremities. Neurological Not Present- Decreased Memory, Fainting, Headaches, Numbness, Seizures, Tingling, Tremor, Trouble walking and Weakness. Psychiatric Not Present- Anxiety, Bipolar, Change in Sleep Pattern, Depression, Fearful and Frequent crying. Endocrine Not Present- Cold Intolerance, Excessive Hunger, Hair Changes, Heat Intolerance, Hot flashes and New Diabetes. Hematology Not Present- Blood Thinners, Easy Bruising, Excessive bleeding, Gland problems, HIV and Persistent Infections.  Vitals Malachy Moan RMA; 05/08/2017 2:06 PM) 05/08/2017 2:05 PM Weight: 183 lb Height: 9in Body Surface Area: 0.45 m Body Mass Index: 1588.42 kg/m  Temp.: 97.3F  Pulse: 74 (Regular)  BP: 120/84 (Sitting, Left Arm,  Standard) 114/85, P 83.  Hgb 14.1      Physical Exam (Callan Yontz O. Hulen Skains MD; 05/08/2017 2:47 PM) General Mental Status-Alert. General Appearance-Well groomed(Looks younger than stated age). Orientation-Oriented X4. Build & Nutrition-Lean. Voice-Normal.  Head and Neck Neck -Note: No bruits. No adenopathy.  Chest and Lung Exam Chest and lung exam reveals -normal excursion with symmetric chest walls, non-tender and normal tactile fremitus and on auscultation, normal breath sounds, no adventitious sounds and normal vocal resonance.  Cardiovascular Cardiovascular examination reveals -normal heart sounds, regular rate and rhythm with no murmurs and normal pedal pulses bilaterally.  Abdomen Inspection Hernias - Inguinal hernia - Right - Reducible(Seems to be direct). Palpation/Percussion Palpation and Percussion of the abdomen reveal - Soft, Non Tender, No Rigidity (guarding) and No hepatosplenomegaly. Auscultation Auscultation of the abdomen reveals - Bowel sounds normal. Note: Good bowel sounds, no masses. Nonpalpable liver or spleen. CT without evidence of metastatic disease. No bleeding or problems with the bowel prep     Assessment & Plan Douglas Warren. 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 Questiion if the patient was tattooed. One is at the rectosigmoid juction and one at the descending colon. Will perform left colectomy distal to the splenic flexure if possible. Current Plans: Left colectomy , possible hemicolectomy.  No evidence of cancer on initial pathology  I spoke with the GI MD and the tumor site was not stained, but because of its size it should be easily palpated.    Will be done in the lithotomy position.  He did receive his Entereg prior to OR.  Douglas Warren. Dahlia Bailiff, MD,  [place] [phone] [facility]

## 2017-05-12 NOTE — Transfer of Care (Signed)
Immediate Anesthesia Transfer of Care Note  Patient: Douglas Warren  Procedure(s) Performed: Procedure(s): LEFT COLECTOMY (Left) COLONOSCOPY (N/A) PROCTOSCOPY (N/A)  Patient Location: PACU  Anesthesia Type:General  Level of Consciousness: awake, alert  and patient cooperative  Airway & Oxygen Therapy: Patient Spontanous Breathing  Post-op Assessment: Report given to RN and Post -op Vital signs reviewed and stable  Post vital signs: Reviewed and stable  Last Vitals:  Vitals:   05/12/17 1043  BP: 114/86  Pulse: 83  Resp: 18  Temp: (!) 36.4 C  SpO2: 96%    Last Pain:  Vitals:   05/12/17 1043  TempSrc: Oral      Patients Stated Pain Goal: 5 (16/38/46 6599)  Complications: No apparent anesthesia complications

## 2017-05-13 ENCOUNTER — Encounter (HOSPITAL_COMMUNITY): Payer: Self-pay | Admitting: General Surgery

## 2017-05-13 LAB — CBC
HCT: 38.3 % — ABNORMAL LOW (ref 39.0–52.0)
Hemoglobin: 12.6 g/dL — ABNORMAL LOW (ref 13.0–17.0)
MCH: 26.3 pg (ref 26.0–34.0)
MCHC: 32.9 g/dL (ref 30.0–36.0)
MCV: 79.8 fL (ref 78.0–100.0)
PLATELETS: 228 10*3/uL (ref 150–400)
RBC: 4.8 MIL/uL (ref 4.22–5.81)
RDW: 13.7 % (ref 11.5–15.5)
WBC: 12.1 10*3/uL — AB (ref 4.0–10.5)

## 2017-05-13 LAB — HEMOGLOBIN A1C
HEMOGLOBIN A1C: 5.3 % (ref 4.8–5.6)
Mean Plasma Glucose: 105 mg/dL

## 2017-05-13 LAB — BASIC METABOLIC PANEL
Anion gap: 9 (ref 5–15)
CHLORIDE: 102 mmol/L (ref 101–111)
CO2: 28 mmol/L (ref 22–32)
CREATININE: 1.16 mg/dL (ref 0.61–1.24)
Calcium: 8.8 mg/dL — ABNORMAL LOW (ref 8.9–10.3)
GFR calc Af Amer: >60 mL/min (ref >60)
Glucose, Bld: 221 mg/dL — ABNORMAL HIGH (ref 65–99)
Potassium: 4.4 mmol/L (ref 3.5–5.1)
SODIUM: 139 mmol/L (ref 135–145)

## 2017-05-13 MED ORDER — ONDANSETRON HCL 4 MG/2ML IJ SOLN
4.0000 mg | Freq: Four times a day (QID) | INTRAMUSCULAR | Status: DC | PRN
  Administered 2017-05-14: 4 mg via INTRAVENOUS
  Filled 2017-05-13: qty 2

## 2017-05-13 MED ORDER — NALOXONE HCL 0.4 MG/ML IJ SOLN: 0.4000 mg | INTRAMUSCULAR | Status: DC | PRN

## 2017-05-13 MED ORDER — SODIUM CHLORIDE 0.9% FLUSH: 9.0000 mL | INTRAVENOUS | Status: DC | PRN

## 2017-05-13 MED ORDER — HYDROMORPHONE 1 MG/ML IV SOLN
INTRAVENOUS | Status: DC
  Administered 2017-05-14: 1 mg via INTRAVENOUS
  Administered 2017-05-14: 0.2 mg via INTRAVENOUS
  Administered 2017-05-14: 0.8 mg via INTRAVENOUS

## 2017-05-13 MED ORDER — DIPHENHYDRAMINE HCL 50 MG/ML IJ SOLN: 12.5000 mg | Freq: Four times a day (QID) | INTRAMUSCULAR | Status: DC | PRN

## 2017-05-13 MED ORDER — DIPHENHYDRAMINE HCL 12.5 MG/5ML PO ELIX: 12.5000 mg | ORAL_SOLUTION | Freq: Four times a day (QID) | ORAL | Status: DC | PRN

## 2017-05-13 NOTE — Op Note (Signed)
[facility] Patient Name: Douglas Warren Procedure Date : 05/12/2017 MRN: [id number] Attending MD: Carol Ada , MD Date of Birth: 1951/09/12 CSN: [id number] Age: 66 Admit Type: Inpatient Procedure:                Colonoscopy Indications:              High risk colon cancer surveillance: Personal                            history of colonic polyps Providers:                Carol Ada, MD, Corliss Parish, Technician Referring MD:              Medicines:                General Anesthesia Complications:            No immediate complications. Estimated Blood Loss:     Estimated blood loss: none. Procedure:                Pre-Anesthesia Assessment:                           - Prior to the procedure, a History and Physical                            was performed, and patient medications and                            allergies were reviewed. The patient's tolerance of                            previous anesthesia was also reviewed. The risks                            and benefits of the procedure and the sedation                            options and risks were discussed with the patient.                            All questions were answered, and informed consent                            was obtained. Prior Anticoagulants: The patient has                            taken no previous anticoagulant or antiplatelet                            agents. ASA Grade Assessment: II - A patient with                            mild systemic disease. After reviewing the risks  and benefits, the patient was deemed in                            satisfactory condition to undergo the procedure.                           - Sedation was administered by an anesthesia                            professional. General anesthesia was attained.                           After obtaining informed consent, the colonoscope                            was passed under  direct vision. Throughout the                            procedure, the patient's blood pressure, pulse, and                            oxygen saturations were monitored continuously. The                            EC-[id number] ([id number]) scope was introduced through                            the descending colostomy and advanced to the the                            cecum, identified by appendiceal orifice and                            ileocecal valve. The colonoscopy was performed                            without difficulty. The patient tolerated the                            procedure well. The quality of the bowel                            preparation was good. The ileocecal valve and the                            appendiceal orifice were photographed. Scope In: Scope Out: Findings:      A 30-50 mm polyp was found in the mid transverse colon. The polyp was       sessile.      The patient was identified to have a secondary polyp which was       carpeting. At the time of the resection, Drs. Hulen Skains and Dalbert Batman were not       able to find the secondary polyp as described by Dr. Collene Mares with the prior       colonoscopy. The  rectosigmoid polypoid mass was able to be resected and       rigid endoscopy by Dr. Hulen Skains was unable to localize the secondary polyp.       GI assistance was required and the colonoscope was inserted through a       purse string ostomy at the splenic flexure. Orientation was difficult,       but it appears that the carpeting polyp was in the mid transverse colon.       The lesion was diffuse, circumfirential, and estimated to be 3-5 cm in       length. Externally Dr. Hulen Skains was able to localize the lesion and       continue with surgical management. Impression:               - One 50 mm polyp in the mid transverse colon.                           - No specimens collected. Moderate Sedation:      None Recommendation:           - Resection per Drs. Hulen Skains and Dalbert Batman.                            - Make the patient NPO starting today.                           - Continue present medications.                           - Patient has a contact number available for                            emergencies. The signs and symptoms of potential                            delayed complications were discussed with the                            patient. Return to normal activities tomorrow.                            Written discharge instructions were provided to the                            patient.                           - Repeat colonoscopy in 1 year for surveillance. Procedure Code(s):        --- Professional ---                           [phone], Colonoscopy through stoma; diagnostic,                            including collection of specimen(s) by brushing or  washing, when performed (separate procedure) Diagnosis Code(s):        --- Professional ---                           D12.3, Benign neoplasm of transverse colon (hepatic                            flexure or splenic flexure)                           Z86.010, Personal history of colonic polyps CPT copyright 2016 American Medical Association. All rights reserved. The codes documented in this report are preliminary and upon coder review may  be revised to meet current compliance requirements. Carol Ada, MD Carol Ada, MD 05/12/2017 4:18:35 PM This report has been signed electronically. Number of Addenda: 0

## 2017-05-13 NOTE — Progress Notes (Signed)
Spoke with MD Hulen Skains and let him know patient wasn't using much of the PCA and when he used it this morning patient got drowsy and slept for 3 hours. I asked if we could switch it to something not as strong and he said we could change the dilaudid full dose to dilaudid reduced dose PCA. Will adjust orders and continue to monitor.

## 2017-05-13 NOTE — Evaluation (Signed)
Physical Therapy Evaluation Patient Details Name: Douglas Warren MRN: [id number] DOB: 15-Feb-1951 Today's Date: 05/13/2017   History of Present Illness  The patient is a 66 year old male who presents with a colonic mass. Now s/p L colectomy, partial R transverse colectomy and colonoscopy 05/12/17.  Clinical Impression  Patient presents with decreased mobility due to pain post surgery.  Feel he will benefit from skilled PT in the acute setting to allow return home with family support.  Likely not to need follow up PT at d/c.     Follow Up Recommendations No PT follow up    Equipment Recommendations  None recommended by PT    Recommendations for Other Services       Precautions / Restrictions Precautions Precautions: None Restrictions Weight Bearing Restrictions: No      Mobility  Bed Mobility Overal bed mobility: Needs Assistance Bed Mobility: Supine to Sit     Supine to sit: HOB elevated;Supervision     General bed mobility comments: S and educ on log roll technique w/ HOB elevated to ~20'; increased time secondary to pain.  Transfers Overall transfer level: Needs assistance Equipment used: Rolling walker (2 wheeled) Transfers: Sit to/from Stand Sit to Stand: From elevated surface;Min guard         General transfer comment: Min guard for balance and cues for hand placement with RW. Cues for breathing technique secondary to pain.   Ambulation/Gait Ambulation/Gait assistance: Min guard;Supervision Ambulation Distance (Feet): 140 Feet Assistive device: Rolling walker (2 wheeled) Gait Pattern/deviations: Step-through pattern;Decreased stride length;Trunk flexed     General Gait Details: cues to straighten up with able, pt burping throughout  Stairs            Wheelchair Mobility    Modified Rankin (Stroke Patients Only)       Balance Overall balance assessment: Needs assistance Sitting-balance support: No upper extremity supported;Feet  supported Sitting balance-Leahy Scale: Good     Standing balance support: Bilateral upper extremity supported;During functional activity Standing balance-Leahy Scale: Fair                               Pertinent Vitals/Pain Pain Assessment: 0-10 Pain Score: 5  Pain Location: L stomach Pain Descriptors / Indicators: Discomfort;Guarding;Sore Pain Intervention(s): Limited activity within patient's tolerance;Monitored during session;Repositioned    Home Living Family/patient expects to be discharged to:: Private residence Living Arrangements: Spouse/significant other Available Help at Discharge: Family;Available 24 hours/day Type of Home: House Home Access: Stairs to enter Entrance Stairs-Rails: Left Entrance Stairs-Number of Steps: 2 Home Layout: One level Home Equipment: Cane - single point;Hand held shower head;Grab bars - tub/shower      Prior Function Level of Independence: Independent         Comments: Pt works as a Lawyer        Extremity/Trunk Assessment   Upper Extremity Assessment Upper Extremity Assessment: Overall WFL for tasks assessed    Lower Extremity Assessment Lower Extremity Assessment: Overall WFL for tasks assessed       Communication   Communication: No difficulties  Cognition Arousal/Alertness: Awake/alert Behavior During Therapy: WFL for tasks assessed/performed Overall Cognitive Status: Within Functional Limits for tasks assessed  General Comments General comments (skin integrity, edema, etc.): son present throughout session; wife arrived during tx    Exercises     Assessment/Plan    PT Assessment Patient needs continued PT services  PT Problem List Decreased mobility;Decreased strength;Decreased activity tolerance;Decreased balance;Decreased knowledge of use of DME;Pain       PT Treatment Interventions DME instruction;Gait  training;Stair training;Balance training;Functional mobility training;Patient/family education;Therapeutic exercise    PT Goals (Current goals can be found in the Care Plan section)  Acute Rehab PT Goals Patient Stated Goal: to return to independent PT Goal Formulation: With patient Time For Goal Achievement: 05/20/17 Potential to Achieve Goals: Good    Frequency Min 3X/week   Barriers to discharge        Co-evaluation               AM-PAC PT "6 Clicks" Daily Activity  Outcome Measure Difficulty turning over in bed (including adjusting bedclothes, sheets and blankets)?: A Little Difficulty moving from lying on back to sitting on the side of the bed? : A Little Difficulty sitting down on and standing up from a chair with arms (e.g., wheelchair, bedside commode, etc,.)?: Total Help needed moving to and from a bed to chair (including a wheelchair)?: A Little Help needed walking in hospital room?: A Little Help needed climbing 3-5 steps with a railing? : A Little 6 Click Score: 16    End of Session Equipment Utilized During Treatment: Gait belt Activity Tolerance: Patient tolerated treatment well Patient left: in chair;with call bell/phone within reach;with family/visitor present   PT Visit Diagnosis: Difficulty in walking, not elsewhere classified (R26.2)    Time: 5621-3086 PT Time Calculation (min) (ACUTE ONLY): 33 min   Charges:   PT Evaluation $PT Eval Low Complexity: 1 Low PT Treatments $Gait Training: 8-22 mins   PT G CodesMagda Kiel, [place] 05/13/2017   Reginia Naas 05/13/2017, 5:16 PM

## 2017-05-13 NOTE — Progress Notes (Signed)
Inpatient Diabetes Program Recommendations  AACE/ADA: New Consensus Statement on Inpatient Glycemic Control (2015)  Target Ranges:  Prepandial:   less than 140 mg/dL      Peak postprandial:   less than 180 mg/dL (1-2 hours)      Critically ill patients:  140 - 180 mg/dL   Lab Results  Component Value Date   HGBA1C 5.3 05/12/2017    Review of Glycemic Control  Diabetes history: None Current orders for Inpatient glycemic control: None  Inpatient Diabetes Program Recommendations:    Decadron 10 mg given yesterday during surgery. Glucose this am 221 mg/dl in labs. Consider CBGs and possibly Novolog Sensitive Correction 0-9 units tid while inpatient.  Thanks,  Tama Headings RN, MSN, [facility] Inpatient Diabetes Coordinator Team Pager [phone] (8a-5p)

## 2017-05-13 NOTE — Anesthesia Postprocedure Evaluation (Signed)
Anesthesia Post Note  Patient: Douglas Warren  Procedure(s) Performed: Procedure(s) (LRB): LEFT COLECTOMY (Left) COLONOSCOPY (N/A) PROCTOSCOPY (N/A)     Patient location during evaluation: PACU Anesthesia Type: General Level of consciousness: awake and alert Pain management: pain level controlled Vital Signs Assessment: post-procedure vital signs reviewed and stable Respiratory status: spontaneous breathing, nonlabored ventilation, respiratory function stable and patient connected to nasal cannula oxygen Cardiovascular status: blood pressure returned to baseline and stable Postop Assessment: no signs of nausea or vomiting Anesthetic complications: no    Last Vitals:  Vitals:   05/13/17 0400 05/13/17 0500  BP:  123/81  Pulse:  (!) 101  Resp: 18 18  Temp:  36.9 C  SpO2: 97% 98%    Last Pain:  Vitals:   05/13/17 0620  TempSrc:   PainSc: Tyler Deis

## 2017-05-13 NOTE — Progress Notes (Signed)
GS Progress Note Subjective: Patient looking great.  On clears.  Objective: Vital signs in last 24 hours: Temp:  [97.5 F (36.4 C)-98.5 F (36.9 C)] 98.5 F (36.9 C) (08/15 0500) Pulse Rate:  [83-111] 101 (08/15 0500) Resp:  [11-18] 18 (08/15 0500) BP: (114-145)/(81-106) 123/81 (08/15 0500) SpO2:  [94 %-98 %] 98 % (08/15 0500) Weight:  [81.6 kg (180 lb)] 81.6 kg (180 lb) (08/14 1043) Last BM Date: 05/11/17  Intake/Output from previous day: 08/14 0701 - 08/15 0700 In: 4913.8 [P.O.:420; I.V.:3993.8; IV Piggyback:500] Out: 1525 [Urine:1225; Blood:300] Intake/Output this shift: No intake/output data recorded.  Lungs: Clear to auscultation  Abd: Soft, some bowel sounds.  Extremities: No clinical signs or symptoms of CVT  Neuro: Intact  Lab Results: CBC   Recent Labs  05/12/17 1055 05/13/17 0208  WBC 3.8* 12.1*  HGB 14.1 12.6*  HCT 42.2 38.3*  PLT 229 228   BMET  Recent Labs  05/12/17 1055 05/13/17 0208  NA 141 139  K 3.8 4.4  CL 107 102  CO2 25 28  GLUCOSE 85 221*  BUN <5* <5*  CREATININE 0.83 1.16  CALCIUM 9.1 8.8*   PT/INR No results for input(s): LABPROT, INR in the last 72 hours. ABG No results for input(s): PHART, HCO3 in the last 72 hours.  Invalid input(s): PCO2, PO2  Studies/Results: Chest 2 View  Result Date: 05/12/2017 CLINICAL DATA:  Preoperative exam prior to left colectomy today. Nonsmoker. EXAM: CHEST  2 VIEW COMPARISON:  None in PACs FINDINGS: The lungs are well-expanded. There is no focal infiltrate. The interstitial markings are mildly prominent. The heart and pulmonary vascularity are normal. The mediastinum is normal in width. The bony thorax exhibits no acute abnormality. IMPRESSION: There is no active cardiopulmonary disease. Electronically Signed   By: David  Martinique M.D.   On: 05/12/2017 11:19    Anti-infectives: Anti-infectives    Start     Dose/Rate Route Frequency Ordered Stop   05/13/17 0130  cefoTEtan (CEFOTAN) 2 g in  dextrose 5 % 50 mL IVPB     2 g 100 mL/hr over 30 Minutes Intravenous Every 12 hours 05/12/17 1921 05/13/17 0108   05/12/17 1230  cefoTEtan in Dextrose 5% (CEFOTAN) IVPB 2 g     2 g Intravenous To [facility] 05/11/17 1459 05/12/17 1352      Assessment/Plan: s/p Procedure(s): LEFT COLECTOMY COLONOSCOPY PROCTOSCOPY d/c foley Ambulate out of the bed.  Continue clear liquids.  LOS: 1 day    Kathryne Eriksson. Dahlia Bailiff, MD, FACS [phone] [facility] Surgery 05/13/2017

## 2017-05-14 NOTE — Progress Notes (Signed)
GS Progress Note Subjective: Patient is more alert and awake this morning.  More abdominal discomfort, but I cut down his PCA dosage yesterday evening.  He has been using it more frequently.  Pain level has been up to five.  Objective: Vital signs in last 24 hours: Temp:  [97.9 F (36.6 C)-98.7 F (37.1 C)] (P) 98.3 F (36.8 C) (08/16 0520) Pulse Rate:  [70-95] (P) 70 (08/16 0520) Resp:  [15-20] (P) 19 (08/16 0520) BP: (118-137)/(86-93) (P) 139/89 (08/16 0520) SpO2:  [93 %-97 %] (P) 94 % (08/16 0520) Last BM Date: 05/11/17  Intake/Output from previous day: 08/15 0701 - 08/16 0700 In: 3419 [P.O.:120; I.V.:1300] Out: 2000 [Urine:2000] Intake/Output this shift: No intake/output data recorded.  Lungs: clear to auscultation  Abd: Soft, distended, active bowel sounds.  Burping but no flatus.  Wound is covered and seems to be okay.  Extremities: No clinical signs or symptoms of DVT  Neuro: Intact  Lab Results: CBC   Recent Labs  05/12/17 1055 05/13/17 0208  WBC 3.8* 12.1*  HGB 14.1 12.6*  HCT 42.2 38.3*  PLT 229 228   BMET  Recent Labs  05/12/17 1055 05/13/17 0208  NA 141 139  K 3.8 4.4  CL 107 102  CO2 25 28  GLUCOSE 85 221*  BUN <5* <5*  CREATININE 0.83 1.16  CALCIUM 9.1 8.8*   PT/INR No results for input(s): LABPROT, INR in the last 72 hours. ABG No results for input(s): PHART, HCO3 in the last 72 hours.  Invalid input(s): PCO2, PO2  Studies/Results: Chest 2 View  Result Date: 05/12/2017 CLINICAL DATA:  Preoperative exam prior to left colectomy today. Nonsmoker. EXAM: CHEST  2 VIEW COMPARISON:  None in PACs FINDINGS: The lungs are well-expanded. There is no focal infiltrate. The interstitial markings are mildly prominent. The heart and pulmonary vascularity are normal. The mediastinum is normal in width. The bony thorax exhibits no acute abnormality. IMPRESSION: There is no active cardiopulmonary disease. Electronically Signed   By: David  Martinique M.D.    On: 05/12/2017 11:19    Anti-infectives: Anti-infectives    Start     Dose/Rate Route Frequency Ordered Stop   05/13/17 0130  cefoTEtan (CEFOTAN) 2 g in dextrose 5 % 50 mL IVPB     2 g 100 mL/hr over 30 Minutes Intravenous Every 12 hours 05/12/17 1921 05/13/17 0108   05/12/17 1230  cefoTEtan in Dextrose 5% (CEFOTAN) IVPB 2 g     2 g Intravenous To ShortStay Surgical 05/11/17 1459 05/12/17 1352      Assessment/Plan: s/p Procedure(s): LEFT COLECTOMY COLONOSCOPY PROCTOSCOPY Voiding well and frequently  Decrease IVF rate. Do not advance diet yet.  Only took 120cc PO yesterday.    LOS: 2 days    Kathryne Eriksson. Dahlia Bailiff, MD, FACS [phone] [facility] 05/14/2017

## 2017-05-15 LAB — CBC WITH DIFFERENTIAL/PLATELET
Basophils Absolute: 0 10*3/uL (ref 0.0–0.1)
Basophils Relative: 0 %
Eosinophils Absolute: 0 10*3/uL (ref 0.0–0.7)
Eosinophils Relative: 0 %
HEMATOCRIT: 32 % — AB (ref 39.0–52.0)
HEMOGLOBIN: 10.6 g/dL — AB (ref 13.0–17.0)
LYMPHS ABS: 0.9 10*3/uL (ref 0.7–4.0)
Lymphocytes Relative: 9 %
MCH: 26.6 pg (ref 26.0–34.0)
MCHC: 33.1 g/dL (ref 30.0–36.0)
MCV: 80.4 fL (ref 78.0–100.0)
MONOS PCT: 7 %
Monocytes Absolute: 0.8 10*3/uL (ref 0.1–1.0)
NEUTROS ABS: 8.7 10*3/uL — AB (ref 1.7–7.7)
NEUTROS PCT: 84 %
Platelets: 199 10*3/uL (ref 150–400)
RBC: 3.98 MIL/uL — ABNORMAL LOW (ref 4.22–5.81)
RDW: 14.4 % (ref 11.5–15.5)
WBC: 10.4 10*3/uL (ref 4.0–10.5)

## 2017-05-15 LAB — BASIC METABOLIC PANEL
Anion gap: 7 (ref 5–15)
CHLORIDE: 97 mmol/L — AB (ref 101–111)
CO2: 30 mmol/L (ref 22–32)
CREATININE: 0.72 mg/dL (ref 0.61–1.24)
Calcium: 8.7 mg/dL — ABNORMAL LOW (ref 8.9–10.3)
GFR calc Af Amer: >60 mL/min (ref >60)
GFR calc non Af Amer: >60 mL/min (ref >60)
GLUCOSE: 114 mg/dL — AB (ref 65–99)
Potassium: 3.8 mmol/L (ref 3.5–5.1)
Sodium: 134 mmol/L — ABNORMAL LOW (ref 135–145)

## 2017-05-15 MED ORDER — HYDROMORPHONE HCL 1 MG/ML IJ SOLN
0.5000 mg | INTRAMUSCULAR | Status: DC | PRN
  Administered 2017-05-15 (×2): 1 mg via INTRAVENOUS
  Administered 2017-05-16 – 2017-05-17 (×4): 0.5 mg via INTRAVENOUS
  Administered 2017-05-17: 1 mg via INTRAVENOUS
  Administered 2017-05-17: 0.5 mg via INTRAVENOUS
  Administered 2017-05-17 – 2017-05-18 (×2): 1 mg via INTRAVENOUS
  Administered 2017-05-18: 0.5 mg via INTRAVENOUS
  Administered 2017-05-18: 1 mg via INTRAVENOUS
  Administered 2017-05-19: 0.5 mg via INTRAVENOUS
  Filled 2017-05-15 (×14): qty 1

## 2017-05-15 NOTE — Progress Notes (Signed)
Physical Therapy Treatment Patient Details Name: Douglas Warren MRN: [id number] DOB: 1951/09/06 Today's Date: 05/15/2017    History of Present Illness The patient is a 66 year old male who presents with a colonic mass. Now s/p L colectomy, partial R transverse colectomy and colonoscopy 05/12/17.    PT Comments    Patient progressing with ambulation distance/tolerance and able to stand erect on two occasions.  Feel progressing with daily walks 3x yesterday with wife assist.  Will follow up to progress to stairs if able next session.   Follow Up Recommendations  No PT follow up     Equipment Recommendations  None recommended by PT    Recommendations for Other Services       Precautions / Restrictions Precautions Precautions: None    Mobility  Bed Mobility               General bed mobility comments: up in recliner  Transfers Overall transfer level: Needs assistance Equipment used: Rolling walker (2 wheeled) Transfers: Sit to/from Stand Sit to Stand: Supervision         General transfer comment: able to stand without physical help, S for safety with lines  Ambulation/Gait Ambulation/Gait assistance: Min guard;Supervision Ambulation Distance (Feet): 400 Feet Assistive device: Rolling walker (2 wheeled) Gait Pattern/deviations: Step-through pattern;Decreased stride length;Trunk flexed     General Gait Details: able to straighten up twice in standing with decreased UE support, but flexed during ambulation (pt's abdomen distended)   Stairs            Wheelchair Mobility    Modified Rankin (Stroke Patients Only)       Balance Overall balance assessment: Needs assistance Sitting-balance support: No upper extremity supported Sitting balance-Leahy Scale: Good     Standing balance support: Bilateral upper extremity supported Standing balance-Leahy Scale: Fair Standing balance comment: able to stand static without UE support, but moving up to [facility]  without walker LOB to R with minguard/self recovery                            Cognition Arousal/Alertness: Awake/alert Behavior During Therapy: WFL for tasks assessed/performed Overall Cognitive Status: Within Functional Limits for tasks assessed                                        Exercises      General Comments General comments (skin integrity, edema, etc.): wife present and guiding pt through session (pushed IV pole, planning out their day)  reports had walked in hallway 3x yesterday      Pertinent Vitals/Pain Pain Score: 4  Pain Location: L stomach Pain Descriptors / Indicators: Discomfort;Guarding;Sore Pain Intervention(s): Monitored during session;Repositioned    Home Living                      Prior Function            PT Goals (current goals can now be found in the care plan section) Progress towards PT goals: Progressing toward goals    Frequency    Min 3X/week      PT Plan Current plan remains appropriate    Co-evaluation              AM-PAC PT "6 Clicks" Daily Activity  Outcome Measure  Difficulty turning over in bed (including adjusting bedclothes, sheets and blankets)?:  A Little Difficulty moving from lying on back to sitting on the side of the bed? : A Little Difficulty sitting down on and standing up from a chair with arms (e.g., wheelchair, bedside commode, etc,.)?: A Little Help needed moving to and from a bed to chair (including a wheelchair)?: A Little Help needed walking in hospital room?: A Little Help needed climbing 3-5 steps with a railing? : A Little 6 Click Score: 18    End of Session   Activity Tolerance: Patient limited by fatigue Patient left: in bed;with call bell/phone within reach;with family/visitor present   PT Visit Diagnosis: Difficulty in walking, not elsewhere classified (R26.2)     Time: 1537-9432 PT Time Calculation (min) (ACUTE ONLY): 13 min  Charges:  $Gait  Training: 8-22 mins                    G CodesMagda Warren, Douglas Warren 05/15/2017    Douglas Warren 05/15/2017, 8:53 AM

## 2017-05-15 NOTE — Care Management Important Message (Signed)
Important Message  Patient Details  Name: Douglas Warren MRN: [id number] Date of Birth: Oct 26, 1950   Medicare Important Message Given:  Yes    [name] 05/15/2017, 11:28 AM

## 2017-05-15 NOTE — Progress Notes (Signed)
GS Progress Note Subjective: Patient sitting in chair.  Comfortable.  Has had a few bloody spots of drainage from the rectum, not significant bowel movements.    Objective: Vital signs in last 24 hours: Temp:  [98.4 F (36.9 C)-99 F (37.2 C)] 98.5 F (36.9 C) (08/17 0655) Pulse Rate:  [95-115] 98 (08/17 0655) Resp:  [18-20] 18 (08/17 0655) BP: (120-146)/(83-91) 120/83 (08/17 0655) SpO2:  [92 %-98 %] 98 % (08/17 0655) Last BM Date: 05/14/17  Intake/Output from previous day: 08/16 0701 - 08/17 0700 In: 2310 [P.O.:240; I.V.:2070] Out: 825 [Urine:825] Intake/Output this shift: No intake/output data recorded.  Lungs: clear  Abd: Distended, hypoactive bowel sounds.  Not tender at all.  No flatus.  Extremities: No clinical signs or symptoms of DVT  Neuro: Intact  Lab Results: CBC   Recent Labs  05/13/17 0208 05/15/17 0416  WBC 12.1* 10.4  HGB 12.6* 10.6*  HCT 38.3* 32.0*  PLT 228 199   BMET  Recent Labs  05/13/17 0208 05/15/17 0416  NA 139 134*  K 4.4 3.8  CL 102 97*  CO2 28 30  GLUCOSE 221* 114*  BUN <5* <5*  CREATININE 1.16 0.72  CALCIUM 8.8* 8.7*   PT/INR No results for input(s): LABPROT, INR in the last 72 hours. ABG No results for input(s): PHART, HCO3 in the last 72 hours.  Invalid input(s): PCO2, PO2  Studies/Results: No results found.  Anti-infectives: Anti-infectives    Start     Dose/Rate Route Frequency Ordered Stop   05/13/17 0130  cefoTEtan (CEFOTAN) 2 g in dextrose 5 % 50 mL IVPB     2 g 100 mL/hr over 30 Minutes Intravenous Every 12 hours 05/12/17 1921 05/13/17 0108   05/12/17 1230  cefoTEtan in Dextrose 5% (CEFOTAN) IVPB 2 g     2 g Intravenous To ShortStay Surgical 05/11/17 1459 05/12/17 1352      Assessment/Plan: s/p Procedure(s): LEFT COLECTOMY COLONOSCOPY PROCTOSCOPY Advance diet Just to full liquids.  DC PCA but replace with IV meds per nurse.  LOS: 3 days    Kathryne Eriksson. Dahlia Bailiff, MD,  FACS [phone] [facility] 05/15/2017

## 2017-05-16 ENCOUNTER — Inpatient Hospital Stay (HOSPITAL_COMMUNITY): Payer: Medicare Other

## 2017-05-16 MED ORDER — ONDANSETRON HCL 4 MG/2ML IJ SOLN
4.0000 mg | Freq: Four times a day (QID) | INTRAMUSCULAR | Status: DC | PRN
  Administered 2017-05-16 – 2017-05-20 (×5): 4 mg via INTRAVENOUS
  Filled 2017-05-16 (×5): qty 2

## 2017-05-16 NOTE — Progress Notes (Signed)
GS Progress Note Subjective: Patient still bloated, but seems afraid to  Move too much.  Has had another liquid bowel movement, but no flatus.    Objective: Vital signs in last 24 hours: Temp:  [97.5 F (36.4 C)-98.6 F (37 C)] 98.6 F (37 C) (08/18 0533) Pulse Rate:  [98-110] 106 (08/18 0533) Resp:  [17-18] 17 (08/18 0533) BP: (120-147)/(83-99) 147/99 (08/18 0533) SpO2:  [92 %-98 %] 92 % (08/18 0533) Last BM Date: 05/14/17  Intake/Output from previous day: 08/17 0701 - 08/18 0700 In: 960 [P.O.:960] Out: 401 [Urine:401] Intake/Output this shift: Total I/O In: 480 [P.O.:480] Out: 401 [Urine:401]  Lungs: Clear  Abd: Soft, distended, good bowel sounds.  No peritonitis.  Mildly tender, mostly on the right side.  Extremities: No clinical signs or symmptoms of DVT  Neuro: Intact  Lab Results: CBC   Recent Labs  05/15/17 0416  WBC 10.4  HGB 10.6*  HCT 32.0*  PLT 199   BMET  Recent Labs  05/15/17 0416  NA 134*  K 3.8  CL 97*  CO2 30  GLUCOSE 114*  BUN <5*  CREATININE 0.72  CALCIUM 8.7*   PT/INR No results for input(s): LABPROT, INR in the last 72 hours. ABG No results for input(s): PHART, HCO3 in the last 72 hours.  Invalid input(s): PCO2, PO2  Studies/Results: Dg Abd Portable 1v  Result Date: 05/16/2017 CLINICAL DATA:  Abdominal distension post colon surgery EXAM: PORTABLE ABDOMEN - 1 VIEW COMPARISON:  Chest x-ray 05/13/2007 FINDINGS: Midline cutaneous staples. Moderate diffuse increased gas within the small and large bowel, dilated small bowel measuring up to 3.8 cm. IMPRESSION: Moderate diffuse increased bowel gas, findings are suggestive of an ileus. Electronically Signed   By: Donavan Foil M.D.   On: 05/16/2017 01:16    Anti-infectives: Anti-infectives    Start     Dose/Rate Route Frequency Ordered Stop   05/13/17 0130  cefoTEtan (CEFOTAN) 2 g in dextrose 5 % 50 mL IVPB     2 g 100 mL/hr over 30 Minutes Intravenous Every 12 hours 05/12/17 1921  05/13/17 0108   05/12/17 1230  cefoTEtan in Dextrose 5% (CEFOTAN) IVPB 2 g     2 g Intravenous To ShortStay Surgical 05/11/17 1459 05/12/17 1352      Assessment/Plan: s/p Procedure(s): LEFT COLECTOMY COLONOSCOPY PROCTOSCOPY CPM.  Do not advance diet yet.Awaiting expulsion of flatus.  At that point I believe that the patient will turn the corner.  LOS: 4 days    Kathryne Eriksson. Dahlia Bailiff, MD, FACS [phone] [facility] 05/16/2017

## 2017-05-16 NOTE — Progress Notes (Signed)
Pt. Distended abd and felt nauseated tonight around 0000, Dr.Wyatt,James contacted and Zofran and portable X ray ordered.The findings of the chest X ray was called  back to inform Dr.Wyatt and saw the X ray result.

## 2017-05-17 NOTE — Progress Notes (Signed)
5 Days Post-Op   Subjective/Chief Complaint: Doing well.  Tol CLears.  No flatus/BM.  Feels bloated Ambulating well    Objective: Vital signs in last 24 hours: Temp:  [98.3 F (36.8 C)-99.1 F (37.3 C)] 98.3 F (36.8 C) (08/19 0500) Pulse Rate:  [91-101] 100 (08/19 0500) Resp:  [17] 17 (08/19 0500) BP: (130-156)/(97-103) 147/100 (08/19 0500) SpO2:  [92 %-95 %] 94 % (08/19 0500) Last BM Date: 05/16/17  Intake/Output from previous day: 08/18 0701 - 08/19 0700 In: 2878 [P.O.:360; I.V.:2518] Out: 600 [Urine:600] Intake/Output this shift: No intake/output data recorded.  General appearance: alert and cooperative GI: soft, disteneded, active BS, incision c/d/i  Lab Results:   Recent Labs  05/15/17 0416  WBC 10.4  HGB 10.6*  HCT 32.0*  PLT 199   BMET  Recent Labs  05/15/17 0416  NA 134*  K 3.8  CL 97*  CO2 30  GLUCOSE 114*  BUN <5*  CREATININE 0.72  CALCIUM 8.7*   PT/INR No results for input(s): LABPROT, INR in the last 72 hours. ABG No results for input(s): PHART, HCO3 in the last 72 hours.  Invalid input(s): PCO2, PO2  Studies/Results: Dg Abd Portable 1v  Result Date: 05/16/2017 CLINICAL DATA:  Abdominal distension post colon surgery EXAM: PORTABLE ABDOMEN - 1 VIEW COMPARISON:  Chest x-ray 05/13/2007 FINDINGS: Midline cutaneous staples. Moderate diffuse increased gas within the small and large bowel, dilated small bowel measuring up to 3.8 cm. IMPRESSION: Moderate diffuse increased bowel gas, findings are suggestive of an ileus. Electronically Signed   By: Donavan Foil M.D.   On: 05/16/2017 01:16    Anti-infectives: Anti-infectives    Start     Dose/Rate Route Frequency Ordered Stop   05/13/17 0130  cefoTEtan (CEFOTAN) 2 g in dextrose 5 % 50 mL IVPB     2 g 100 mL/hr over 30 Minutes Intravenous Every 12 hours 05/12/17 1921 05/13/17 0108   05/12/17 1230  cefoTEtan in Dextrose 5% (CEFOTAN) IVPB 2 g     2 g Intravenous To ShortStay Surgical 05/11/17  1459 05/12/17 1352      Assessment/Plan: s/p Procedure(s): LEFT COLECTOMY (Left) COLONOSCOPY (N/A) PROCTOSCOPY (N/A) -Con't to ambulate -await bowel function, expect it today  LOS: 5 days    Rosario Jacks., [facility] 05/17/2017

## 2017-05-18 LAB — CBC WITH DIFFERENTIAL/PLATELET
BASOS PCT: 0 %
Basophils Absolute: 0 10*3/uL (ref 0.0–0.1)
EOS ABS: 0.1 10*3/uL (ref 0.0–0.7)
Eosinophils Relative: 1 %
HEMATOCRIT: 31.5 % — AB (ref 39.0–52.0)
HEMOGLOBIN: 10.4 g/dL — AB (ref 13.0–17.0)
LYMPHS ABS: 0.9 10*3/uL (ref 0.7–4.0)
Lymphocytes Relative: 14 %
MCH: 26.1 pg (ref 26.0–34.0)
MCHC: 33 g/dL (ref 30.0–36.0)
MCV: 79.1 fL (ref 78.0–100.0)
MONO ABS: 0.5 10*3/uL (ref 0.1–1.0)
MONOS PCT: 8 %
NEUTROS PCT: 77 %
Neutro Abs: 4.9 10*3/uL (ref 1.7–7.7)
Platelets: 272 10*3/uL (ref 150–400)
RBC: 3.98 MIL/uL — ABNORMAL LOW (ref 4.22–5.81)
RDW: 14.1 % (ref 11.5–15.5)
WBC: 6.4 10*3/uL (ref 4.0–10.5)

## 2017-05-18 LAB — BASIC METABOLIC PANEL
Anion gap: 9 (ref 5–15)
BUN: 5 mg/dL — ABNORMAL LOW (ref 6–20)
CALCIUM: 8.8 mg/dL — AB (ref 8.9–10.3)
CHLORIDE: 100 mmol/L — AB (ref 101–111)
CO2: 26 mmol/L (ref 22–32)
CREATININE: 0.69 mg/dL (ref 0.61–1.24)
GFR calc non Af Amer: >60 mL/min (ref >60)
GLUCOSE: 102 mg/dL — AB (ref 65–99)
Potassium: 3.5 mmol/L (ref 3.5–5.1)
Sodium: 135 mmol/L (ref 135–145)

## 2017-05-18 MED ORDER — METOPROLOL TARTRATE 12.5 MG HALF TABLET
12.5000 mg | ORAL_TABLET | Freq: Two times a day (BID) | ORAL | Status: DC
  Administered 2017-05-18 – 2017-05-20 (×2): 12.5 mg via ORAL
  Filled 2017-05-18 (×5): qty 1

## 2017-05-18 MED ORDER — OXYCODONE HCL 5 MG PO TABS
5.0000 mg | ORAL_TABLET | ORAL | Status: DC | PRN
  Administered 2017-05-18 – 2017-05-20 (×5): 5 mg via ORAL
  Filled 2017-05-18 (×6): qty 1

## 2017-05-18 NOTE — Progress Notes (Signed)
Physical Therapy Treatment & Discharge Patient Details Name: Douglas Warren MRN: [id number] DOB: 1950-12-03 Today's Date: 05/18/2017    History of Present Illness The patient is a 66 year old male who presents with a colonic mass. Now s/p L colectomy, partial R transverse colectomy and colonoscopy 05/12/17.    PT Comments    Patient progressing to have met acute PT goals and walking multiple times a day with family.  No further skilled PT needs at this time.  Will d/c PT; please consult again if further issues arise.    Follow Up Recommendations  No PT follow up     Equipment Recommendations  None recommended by PT    Recommendations for Other Services       Precautions / Restrictions Precautions Precautions: None    Mobility  Bed Mobility               General bed mobility comments: up walking in hall with family   Transfers                 General transfer comment: up walking in hall with family  Ambulation/Gait Ambulation/Gait assistance: Modified independent (Device/Increase time) Ambulation Distance (Feet): 400 Feet (or more) Assistive device: Rolling walker (2 wheeled) Gait Pattern/deviations: Step-through pattern;Decreased stride length;Trunk flexed     General Gait Details: flexed through most of walk, but able to straighten at times, wife pulling IV pole   Stairs Stairs: Yes   Stair Management: One rail Left;Step to pattern;Sideways Number of Stairs: 4 General stair comments: demonstrated technique and pt performed with wife and son present.  Wheelchair Mobility    Modified Rankin (Stroke Patients Only)       Balance Overall balance assessment: Needs assistance   Sitting balance-Leahy Scale: Good       Standing balance-Leahy Scale: Good Standing balance comment: can stand and move dynamically without walker, but uses for hallway ambulation                            Cognition Arousal/Alertness:  Awake/alert Behavior During Therapy: WFL for tasks assessed/performed Overall Cognitive Status: Within Functional Limits for tasks assessed                                        Exercises      General Comments        Pertinent Vitals/Pain Pain Assessment: Faces Faces Pain Scale: Hurts little more Pain Location: L stomach Pain Descriptors / Indicators: Discomfort;Guarding;Tightness Pain Intervention(s): Monitored during session    Home Living                      Prior Function            PT Goals (current goals can now be found in the care plan section) Progress towards PT goals: Goals met/education completed, patient discharged from PT    Frequency    Min 3X/week      PT Plan Current plan remains appropriate    Co-evaluation              AM-PAC PT "6 Clicks" Daily Activity  Outcome Measure  Difficulty turning over in bed (including adjusting bedclothes, sheets and blankets)?: None Difficulty moving from lying on back to sitting on the side of the bed? : A Little Difficulty sitting down on and standing  up from a chair with arms (e.g., wheelchair, bedside commode, etc,.)?: None Help needed moving to and from a bed to chair (including a wheelchair)?: None Help needed walking in hospital room?: A Little Help needed climbing 3-5 steps with a railing? : A Little 6 Click Score: 21    End of Session   Activity Tolerance: Patient tolerated treatment well Patient left: with family/visitor present (walking back to room with family)   PT Visit Diagnosis: Difficulty in walking, not elsewhere classified (R26.2)     Time: 7482-7078 PT Time Calculation (min) (ACUTE ONLY): 8 min  Charges:  $Gait Training: 8-22 mins                    G CodesMagda Kiel, [place] 05/18/2017    Reginia Naas 05/18/2017, 4:30 PM

## 2017-05-18 NOTE — Progress Notes (Signed)
GS Progress Note Subjective: Patient tends to have the most discomfort around midnight, better now.  Still distended, butnot more consistently passing gas.  Objective: Vital signs in last 24 hours: Temp:  [97.9 F (36.6 C)-99 F (37.2 C)] 97.9 F (36.6 C) (08/20 0532) Pulse Rate:  [95-100] 98 (08/20 0532) Resp:  [18] 18 (08/20 0532) BP: (143-150)/(91-103) 143/103 (08/20 0532) SpO2:  [90 %-96 %] 96 % (08/20 0532) Last BM Date: 05/16/17  Intake/Output from previous day: 08/19 0701 - 08/20 0700 In: 3165 [P.O.:315; I.V.:2850] Out: 1 [Stool:1] Intake/Output this shift: No intake/output data recorded.  Lungs: Clear  Abd: Great bowel sounds.  Extremities: No changes.  No clinical signs or symptoms of DVT  Neuro: Intact  Lab Results: CBC  No results for input(s): WBC, HGB, HCT, PLT in the last 72 hours. BMET No results for input(s): NA, K, CL, CO2, GLUCOSE, BUN, CREATININE, CALCIUM in the last 72 hours. PT/INR No results for input(s): LABPROT, INR in the last 72 hours. ABG No results for input(s): PHART, HCO3 in the last 72 hours.  Invalid input(s): PCO2, PO2  Studies/Results: No results found.  Anti-infectives: Anti-infectives    Start     Dose/Rate Route Frequency Ordered Stop   05/13/17 0130  cefoTEtan (CEFOTAN) 2 g in dextrose 5 % 50 mL IVPB     2 g 100 mL/hr over 30 Minutes Intravenous Every 12 hours 05/12/17 1921 05/13/17 0108   05/12/17 1230  cefoTEtan in Dextrose 5% (CEFOTAN) IVPB 2 g     2 g Intravenous To ShortStay Surgical 05/11/17 1459 05/12/17 1352      Assessment/Plan: s/p Procedure(s): LEFT COLECTOMY COLONOSCOPY PROCTOSCOPY Improving ileus, but not ready to advance diet  Hypertension.  Will start low dose beta-blocker.  Decrease IVF to 50  LOS: 6 days    Kathryne Eriksson. Dahlia Bailiff, MD, FACS [phone] [facility] 05/18/2017

## 2017-05-19 NOTE — Care Management Important Message (Signed)
Important Message  Patient Details  Name: Douglas Warren MRN: [id number] Date of Birth: [birth year]   Medicare Important Message Given:  Yes    Orbie Pyo 05/19/2017, 12:19 PM

## 2017-05-19 NOTE — Progress Notes (Signed)
GS Progress Note Subjective: Patient doing very well.  Has passed a lot of gas. Much more comfortable  Objective: Vital signs in last 24 hours: Temp:  [97.9 F (36.6 C)-98.5 F (36.9 C)] 97.9 F (36.6 C) (08/21 0641) Pulse Rate:  [61-86] 78 (08/21 0641) Resp:  [18] 18 (08/21 0641) BP: (126-138)/(82-98) 138/82 (08/21 0641) SpO2:  [95 %-97 %] 95 % (08/21 0641) Last BM Date: 05/18/17  Intake/Output from previous day: 08/20 0701 - 08/21 0700 In: 440 [P.O.:440] Out: -  Intake/Output this shift: No intake/output data recorded.  Lungs: Clear  Abd: softer, flatter, no tenderness  Extremities: No clinical signs or symptoms of DVT  Neuro: Intact  Lab Results: CBC   Recent Labs  05/18/17 0836  WBC 6.4  HGB 10.4*  HCT 31.5*  PLT 272   BMET  Recent Labs  05/18/17 0836  NA 135  K 3.5  CL 100*  CO2 26  GLUCOSE 102*  BUN 5*  CREATININE 0.69  CALCIUM 8.8*   PT/INR No results for input(s): LABPROT, INR in the last 72 hours. ABG No results for input(s): PHART, HCO3 in the last 72 hours.  Invalid input(s): PCO2, PO2  Studies/Results: No results found.  Anti-infectives: Anti-infectives    Start     Dose/Rate Route Frequency Ordered Stop   05/13/17 0130  cefoTEtan (CEFOTAN) 2 g in dextrose 5 % 50 mL IVPB     2 g 100 mL/hr over 30 Minutes Intravenous Every 12 hours 05/12/17 1921 05/13/17 0108   05/12/17 1230  cefoTEtan in Dextrose 5% (CEFOTAN) IVPB 2 g     2 g Intravenous To ShortStay Surgical 05/11/17 1459 05/12/17 1352      Assessment/Plan: s/p Procedure(s): LEFT COLECTOMY COLONOSCOPY PROCTOSCOPY Advance diet Plan for discharge tomorrow Pathology discussed with family  Will make oncology referral  LOS: 7 days    Kathryne Eriksson. Dahlia Bailiff, MD, FACS [phone] [facility] 05/19/2017

## 2017-05-20 MED ORDER — OXYCODONE HCL 5 MG PO TABS: 5.0000 mg | ORAL_TABLET | ORAL | 0 refills | Status: DC | PRN

## 2017-05-20 MED ORDER — ONDANSETRON HCL 4 MG PO TABS: 4.0000 mg | ORAL_TABLET | Freq: Three times a day (TID) | ORAL | 1 refills | Status: AC | PRN

## 2017-05-20 MED ORDER — GABAPENTIN 300 MG PO CAPS: 300.0000 mg | ORAL_CAPSULE | Freq: Two times a day (BID) | ORAL | 0 refills | Status: AC

## 2017-05-20 MED ORDER — ACETAMINOPHEN 325 MG PO TABS: 650.0000 mg | ORAL_TABLET | Freq: Four times a day (QID) | ORAL | Status: DC | PRN

## 2017-05-20 MED ORDER — METOPROLOL TARTRATE 25 MG PO TABS: 12.5000 mg | ORAL_TABLET | Freq: Two times a day (BID) | ORAL | 1 refills | Status: DC

## 2017-05-20 NOTE — Discharge Instructions (Signed)
Open Colectomy, Care After This sheet gives you information about how to care for yourself after your procedure. Your health care provider may also give you more specific instructions. If you have problems or questions, contact your health care provider. What can I expect after the procedure? After the procedure, it is common to have:  Pain in your abdomen, especially along your incision.  Tiredness. Your energy level will return to normal over the next several weeks.  Constipation.  Nausea.  Difficulty urinating.  Follow these instructions at home: Activity  You may be able to return to most of your normal activities within 1-2 weeks, such as working, walking up stairs, and sexual activity.  Avoid activities that require a lot of energy for 4-6 weeks after surgery, such as running, climbing, and lifting heavy objects. Ask your health care provider what activities are safe for you.  Take rest breaks during the day as needed.  Do not drive for 1-2 weeks or until your health care provider says that it is safe.  Do not drive or use heavy machinery while taking prescription pain medicines.  Do not lift anything that is heavier than 20 lb (9.6 kg) until your health care provider says that it is safe. Incision care  Follow instructions from your health care provider about how to take care of your incision. Make sure you: ? Wash your hands with soap and water before you change your bandage (dressing). If soap and water are not available, use hand sanitizer. ? Change your dressing as told by your health care provider. ? Leave stitches (sutures) or staples in place. These skin closures may need to stay in place for 2 weeks or longer.  Avoid wearing tight clothing around your incision.  Protect your incision area from the sun.  Check your incision area every day for signs of infection. Check for: ? More redness, swelling, or pain. ? More fluid or blood. ? Warmth. ? Pus or a bad  smell. General instructions  Do not take baths, swim, or use a hot tub until your health care provider approves. Ask your health care provider when you may shower.  Take over-the-counter and prescription medicines, including stool softeners, only as told by your health care provider.  Eat a low-fat and low-fiber diet for the first 4 weeks after surgery.  Keep all follow-up visits as told by your health care provider. This is important. Contact a health care provider if:  You have more redness, swelling, or pain around your incision.  You have more fluid or blood coming from your incision.  Your incision feels warm to the touch.  You have pus or a bad smell coming from your incision.  You have a fever or chills.  You do not have a bowel movement 2-3 days after surgery.  You cannot eat or drink for 24 hours or more.  You have persistent nausea and vomiting.  You have abdominal pain that gets worse and does not get better with medicine. Get help right away if:  You have chest pain.  You have shortness of breath.  You have pain or swelling in your legs.  Your incision breaks open after your sutures or staples have been removed.  You have bleeding from the rectum. This information is not intended to replace advice given to you by your health care provider. Make sure you discuss any questions you have with your health care provider. Kathryne Eriksson. Dahlia Bailiff, MD, [place] [phone] [facility]

## 2017-05-20 NOTE — Discharge Summary (Signed)
Physician Discharge Summary  Patient ID: Douglas Warren MRN: [id number] DOB/AGE: 05-Sep-1951 66 y.o.  Admit date: 05/12/2017 Discharge date: 05/20/2017  Admission Diagnoses:  Discharge Diagnoses:  Active Problems:   Tubulovillous adenoma of large intestine   Discharged Condition: good  Hospital Course: Admitted after partial right transverse colectomy and extended rectosigmoid/descending colectomy.  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  Postoperatively he has done well although still somewhat bloated, he is passing flatus and having bowel.  His BM this AM was bilious and had some solid material with it.    He has been afebrile postoperatively, his WBC is normal. And his hemoglobin dropped postop to about 10.7.   He has been moderately hypertensive postoperatively also and I have had to treat him with beta-blocker at a low dose (metoprolol 12.5mg  bid) which I will continue after discharge   Consults: None  Significant Diagnostic Studies: labs: cbc/bmet and radiology: KUB: Ileus  Treatments: IV hydration, antibiotics: Preop prophylaxis, cardiac meds: metoprolol and surgery: colectomy as described.  Discharge Exam: Blood pressure (!) 142/90, pulse 82, temperature 98.2 F (36.8 C), temperature source Oral, resp. rate 19, height 5\' 9"  (1.753 m), weight 81.6 kg (180 lb), SpO2 99 %. General appearance: alert, cooperative, no distress and sitting up in chair, a bit nauseated this AM Resp: clear to auscultation bilaterally GI: soft, non-tender; bowel sounds normal; no masses,  no organomegaly and Mildly distended, very active bowel sounds.  Incision is clean and dry.  Disposition: 01-Home or Self Care  Discharge Instructions    Call MD for:  difficulty breathing, headache or visual disturbances    Complete by:  As directed    Call MD for:  extreme fatigue    Complete by:  As  directed    Call MD for:  hives    Complete by:  As directed    Call MD for:  persistant dizziness or light-headedness    Complete by:  As directed    Call MD for:  persistant nausea and vomiting    Complete by:  As directed    Call MD for:  redness, tenderness, or signs of infection (pain, swelling, redness, odor or green/yellow discharge around incision site)    Complete by:  As directed    Call MD for:  severe uncontrolled pain    Complete by:  As directed    Call MD for:  temperature >100.4    Complete by:  As directed    Change dressing (specify)    Complete by:  As directed    Dressing change: one time per day using 4x4 gauze.  May shower beforehand and pat dry..   Diet - low sodium heart healthy    Complete by:  As directed    Discharge instructions    Complete by:  As directed    See specific discharge instructions for colectomy   Driving Restrictions    Complete by:  As directed    2 weeks   Increase activity slowly    Complete by:  As directed    Lifting restrictions    Complete by:  As directed    5 weeks nothing greater than 20 pounds   Sexual Activity Restrictions    Complete by:  As directed    Until pain is well controlled     Allergies as of 05/20/2017   No Known Allergies     Medication List  TAKE these medications   cholecalciferol 1000 units tablet Commonly known as:  VITAMIN D Take 1,000 Units by mouth daily.   gabapentin 300 MG capsule Commonly known as:  NEURONTIN Take 1 capsule (300 mg total) by mouth 2 (two) times daily.   metoprolol tartrate 25 MG tablet Commonly known as:  LOPRESSOR Take 0.5 tablets (12.5 mg total) by mouth 2 (two) times daily.   ondansetron 4 MG tablet Commonly known as:  ZOFRAN Take 1 tablet (4 mg total) by mouth every 8 (eight) hours as needed for nausea.   oxyCODONE 5 MG immediate release tablet Commonly known as:  Oxy IR/ROXICODONE Take 1 tablet (5 mg total) by mouth every 4 (four) hours as needed for  moderate pain or severe pain.   PROBIOTIC DAILY PO Take 1 capsule by mouth daily.            Discharge Care Instructions        Start     Ordered   05/20/17 0000  gabapentin (NEURONTIN) 300 MG capsule  2 times daily     05/20/17 0817   05/20/17 0000  metoprolol tartrate (LOPRESSOR) 25 MG tablet  2 times daily     05/20/17 0817   05/20/17 0000  oxyCODONE (OXY IR/ROXICODONE) 5 MG immediate release tablet  Every 4 hours PRN     05/20/17 0817   05/20/17 0000  Diet - low sodium heart healthy     05/20/17 0817   05/20/17 0000  Increase activity slowly     05/20/17 0817   05/20/17 0000  Discharge instructions    Comments:  See specific discharge instructions for colectomy   05/20/17 0817   05/20/17 0000  Driving Restrictions    Comments:  2 weeks   05/20/17 0817   05/20/17 0000  Lifting restrictions    Comments:  5 weeks nothing greater than 20 pounds   05/20/17 0817   05/20/17 0000  Sexual Activity Restrictions    Comments:  Until pain is well controlled   05/20/17 0817   05/20/17 0000  Change dressing (specify)    Comments:  Dressing change: one time per day using 4x4 gauze.  May shower beforehand and pat dry.Marland Kitchen   05/20/17 0817   05/20/17 0000  Call MD for:  extreme fatigue     05/20/17 0817   05/20/17 0000  Call MD for:  persistant dizziness or light-headedness     05/20/17 0817   05/20/17 0000  Call MD for:  hives     05/20/17 0817   05/20/17 0000  Call MD for:  difficulty breathing, headache or visual disturbances     05/20/17 0817   05/20/17 0000  Call MD for:  redness, tenderness, or signs of infection (pain, swelling, redness, odor or green/yellow discharge around incision site)     05/20/17 0817   05/20/17 0000  Call MD for:  severe uncontrolled pain     05/20/17 0817   05/20/17 0000  Call MD for:  persistant nausea and vomiting     05/20/17 0817   05/20/17 0000  Call MD for:  temperature >100.4     05/20/17 0817   05/20/17 0000  ondansetron (ZOFRAN) 4 MG  tablet  Every 8 hours PRN     05/20/17 0841     Follow-up Information    Judeth Horn, MD. Schedule an appointment as soon as possible for a visit on 06/02/2017.   Specialty:  General Surgery Why:  first appointment ot se me after  nurse visit for staple removal. Contact information: [address] [place] [address] [phone]        [facility] [place]. Call.   Specialty:  General Surgery Why:  Call Carlene Coria for nurse visit next Tuesday or Wednesday for staple removal. Contact information: [address] [place] [phone]          Signed: Judeth Horn 05/20/2017, 8:41 AM

## 2017-05-20 NOTE — Progress Notes (Addendum)
Discharge instructions gone over with patient and spouse. Home medications reviewed. Prescription given, others electronically sent. Follow up appointment is made. Appointment to remove staples to be made. Diet, activity, incisional care, and reasons to call the doctor gone over. Signs and symptoms of infection reviewed. Patient and spouse verbalized understanding of instructions.

## 2017-05-23 ENCOUNTER — Inpatient Hospital Stay (HOSPITAL_COMMUNITY)
Admission: EM | Admit: 2017-05-23 | Discharge: 2017-06-04 | DRG: 329 | Disposition: A | Discharge status: Home Health | Payer: Medicare Other | Attending: General Surgery | Admitting: General Surgery

## 2017-05-23 ENCOUNTER — Encounter (HOSPITAL_COMMUNITY): Payer: Self-pay

## 2017-05-23 ENCOUNTER — Emergency Department (HOSPITAL_COMMUNITY): Payer: Medicare Other

## 2017-05-23 DIAGNOSIS — R6510 Systemic inflammatory response syndrome (SIRS) of non-infectious origin without acute organ dysfunction: Secondary | ICD-10-CM | POA: Diagnosis not present

## 2017-05-23 DIAGNOSIS — R000 Tachycardia, unspecified: Secondary | ICD-10-CM | POA: Diagnosis not present

## 2017-05-23 DIAGNOSIS — Z9049 Acquired absence of other specified parts of digestive tract: Secondary | ICD-10-CM | POA: Diagnosis not present

## 2017-05-23 DIAGNOSIS — D509 Iron deficiency anemia, unspecified: Secondary | ICD-10-CM | POA: Diagnosis present

## 2017-05-23 DIAGNOSIS — R509 Fever, unspecified: Secondary | ICD-10-CM | POA: Diagnosis not present

## 2017-05-23 DIAGNOSIS — R109 Unspecified abdominal pain: Secondary | ICD-10-CM | POA: Diagnosis not present

## 2017-05-23 DIAGNOSIS — A419 Sepsis, unspecified organism: Secondary | ICD-10-CM

## 2017-05-23 DIAGNOSIS — B372 Candidiasis of skin and nail: Secondary | ICD-10-CM | POA: Diagnosis not present

## 2017-05-23 DIAGNOSIS — K9189 Other postprocedural complications and disorders of digestive system: Principal | ICD-10-CM | POA: Diagnosis present

## 2017-05-23 DIAGNOSIS — Z9889 Other specified postprocedural states: Secondary | ICD-10-CM

## 2017-05-23 DIAGNOSIS — J9811 Atelectasis: Secondary | ICD-10-CM | POA: Diagnosis not present

## 2017-05-23 DIAGNOSIS — Z0189 Encounter for other specified special examinations: Secondary | ICD-10-CM

## 2017-05-23 DIAGNOSIS — K631 Perforation of intestine (nontraumatic): Secondary | ICD-10-CM

## 2017-05-23 DIAGNOSIS — K660 Peritoneal adhesions (postprocedural) (postinfection): Secondary | ICD-10-CM | POA: Diagnosis present

## 2017-05-23 DIAGNOSIS — E876 Hypokalemia: Secondary | ICD-10-CM | POA: Diagnosis not present

## 2017-05-23 DIAGNOSIS — E870 Hyperosmolality and hypernatremia: Secondary | ICD-10-CM | POA: Diagnosis present

## 2017-05-23 DIAGNOSIS — R9431 Abnormal electrocardiogram [ECG] [EKG]: Secondary | ICD-10-CM | POA: Diagnosis not present

## 2017-05-23 DIAGNOSIS — Y832 Surgical operation with anastomosis, bypass or graft as the cause of abnormal reaction of the patient, or of later complication, without mention of misadventure at the time of the procedure: Secondary | ICD-10-CM | POA: Diagnosis present

## 2017-05-23 NOTE — ED Triage Notes (Signed)
D/c'd from hospital 05-20-17 s/p left hemi colectomy.  Staples intact, no s/s infection.  Onset last night fever, took Tylenol.  Temp tonight 101.4, Tylenol taken 7:30p, oxycodone @ 7pm.  Onset today pain in RUQ pain.

## 2017-05-23 NOTE — ED Notes (Signed)
Pt took home oxycodone and zofran in triage

## 2017-05-24 ENCOUNTER — Emergency Department (HOSPITAL_COMMUNITY): Payer: Medicare Other | Admitting: Certified Registered"

## 2017-05-24 ENCOUNTER — Emergency Department (HOSPITAL_COMMUNITY): Payer: Medicare Other

## 2017-05-24 ENCOUNTER — Inpatient Hospital Stay (HOSPITAL_COMMUNITY): Payer: Medicare Other

## 2017-05-24 ENCOUNTER — Encounter (HOSPITAL_COMMUNITY)
Admission: EM | Disposition: A | Discharge status: Home Health | Payer: Self-pay | Source: Home / Self Care | Attending: General Surgery

## 2017-05-24 ENCOUNTER — Encounter (HOSPITAL_COMMUNITY): Payer: Self-pay | Admitting: Certified Registered"

## 2017-05-24 DIAGNOSIS — E876 Hypokalemia: Secondary | ICD-10-CM | POA: Diagnosis not present

## 2017-05-24 DIAGNOSIS — Z9889 Other specified postprocedural states: Secondary | ICD-10-CM

## 2017-05-24 DIAGNOSIS — B372 Candidiasis of skin and nail: Secondary | ICD-10-CM | POA: Diagnosis not present

## 2017-05-24 DIAGNOSIS — K9189 Other postprocedural complications and disorders of digestive system: Secondary | ICD-10-CM | POA: Diagnosis present

## 2017-05-24 DIAGNOSIS — Y832 Surgical operation with anastomosis, bypass or graft as the cause of abnormal reaction of the patient, or of later complication, without mention of misadventure at the time of the procedure: Secondary | ICD-10-CM | POA: Diagnosis present

## 2017-05-24 DIAGNOSIS — E870 Hyperosmolality and hypernatremia: Secondary | ICD-10-CM | POA: Diagnosis present

## 2017-05-24 DIAGNOSIS — K567 Ileus, unspecified: Secondary | ICD-10-CM | POA: Diagnosis not present

## 2017-05-24 DIAGNOSIS — Z4682 Encounter for fitting and adjustment of non-vascular catheter: Secondary | ICD-10-CM | POA: Diagnosis not present

## 2017-05-24 DIAGNOSIS — K660 Peritoneal adhesions (postprocedural) (postinfection): Secondary | ICD-10-CM | POA: Diagnosis present

## 2017-05-24 DIAGNOSIS — R6510 Systemic inflammatory response syndrome (SIRS) of non-infectious origin without acute organ dysfunction: Secondary | ICD-10-CM | POA: Diagnosis present

## 2017-05-24 DIAGNOSIS — K631 Perforation of intestine (nontraumatic): Secondary | ICD-10-CM | POA: Diagnosis present

## 2017-05-24 DIAGNOSIS — R000 Tachycardia, unspecified: Secondary | ICD-10-CM | POA: Diagnosis not present

## 2017-05-24 DIAGNOSIS — D509 Iron deficiency anemia, unspecified: Secondary | ICD-10-CM | POA: Diagnosis present

## 2017-05-24 DIAGNOSIS — R109 Unspecified abdominal pain: Secondary | ICD-10-CM | POA: Diagnosis present

## 2017-05-24 DIAGNOSIS — Z9049 Acquired absence of other specified parts of digestive tract: Secondary | ICD-10-CM | POA: Diagnosis not present

## 2017-05-24 HISTORY — PX: LAPAROTOMY: SHX154

## 2017-05-24 LAB — COMPREHENSIVE METABOLIC PANEL
ALBUMIN: 2.7 g/dL — AB (ref 3.5–5.0)
ALT: 37 U/L (ref 17–63)
AST: 16 U/L (ref 15–41)
Alkaline Phosphatase: 66 U/L (ref 38–126)
Anion gap: 12 (ref 5–15)
BUN: 19 mg/dL (ref 6–20)
CHLORIDE: 96 mmol/L — AB (ref 101–111)
CO2: 25 mmol/L (ref 22–32)
CREATININE: 0.96 mg/dL (ref 0.61–1.24)
Calcium: 8.8 mg/dL — ABNORMAL LOW (ref 8.9–10.3)
GFR calc Af Amer: >60 mL/min (ref >60)
GFR calc non Af Amer: >60 mL/min (ref >60)
GLUCOSE: 124 mg/dL — AB (ref 65–99)
POTASSIUM: 3.9 mmol/L (ref 3.5–5.1)
SODIUM: 133 mmol/L — AB (ref 135–145)
Total Bilirubin: 1.2 mg/dL (ref 0.3–1.2)
Total Protein: 6.7 g/dL (ref 6.5–8.1)

## 2017-05-24 LAB — BASIC METABOLIC PANEL
Anion gap: 8 (ref 5–15)
BUN: 12 mg/dL (ref 6–20)
CHLORIDE: 101 mmol/L (ref 101–111)
CO2: 22 mmol/L (ref 22–32)
Calcium: 7.6 mg/dL — ABNORMAL LOW (ref 8.9–10.3)
Creatinine, Ser: 0.86 mg/dL (ref 0.61–1.24)
GFR calc non Af Amer: >60 mL/min (ref >60)
Glucose, Bld: 114 mg/dL — ABNORMAL HIGH (ref 65–99)
POTASSIUM: 3.8 mmol/L (ref 3.5–5.1)
SODIUM: 131 mmol/L — AB (ref 135–145)

## 2017-05-24 LAB — URINALYSIS, ROUTINE W REFLEX MICROSCOPIC
BILIRUBIN URINE: NEGATIVE
Glucose, UA: NEGATIVE mg/dL
Hgb urine dipstick: NEGATIVE
KETONES UR: 20 mg/dL — AB
Leukocytes, UA: NEGATIVE
Nitrite: NEGATIVE
PH: 5 (ref 5.0–8.0)
Protein, ur: 30 mg/dL — AB
SPECIFIC GRAVITY, URINE: 1.026 (ref 1.005–1.030)
SQUAMOUS EPITHELIAL / LPF: NONE SEEN

## 2017-05-24 LAB — CBC WITH DIFFERENTIAL/PLATELET
Basophils Absolute: 0 10*3/uL (ref 0.0–0.1)
Basophils Relative: 0 %
EOS ABS: 0 10*3/uL (ref 0.0–0.7)
EOS PCT: 0 %
HCT: 34.8 % — ABNORMAL LOW (ref 39.0–52.0)
Hemoglobin: 11.7 g/dL — ABNORMAL LOW (ref 13.0–17.0)
LYMPHS ABS: 0.3 10*3/uL — AB (ref 0.7–4.0)
LYMPHS PCT: 2 %
MCH: 26.1 pg (ref 26.0–34.0)
MCHC: 33.6 g/dL (ref 30.0–36.0)
MCV: 77.7 fL — AB (ref 78.0–100.0)
MONOS PCT: 2 %
Monocytes Absolute: 0.4 10*3/uL (ref 0.1–1.0)
Neutro Abs: 19.3 10*3/uL — ABNORMAL HIGH (ref 1.7–7.7)
Neutrophils Relative %: 96 %
PLATELETS: 531 10*3/uL — AB (ref 150–400)
RBC: 4.48 MIL/uL (ref 4.22–5.81)
RDW: 14.5 % (ref 11.5–15.5)
WBC: 20.1 10*3/uL — ABNORMAL HIGH (ref 4.0–10.5)

## 2017-05-24 LAB — GLUCOSE, CAPILLARY: GLUCOSE-CAPILLARY: 112 mg/dL — AB (ref 65–99)

## 2017-05-24 LAB — I-STAT CG4 LACTIC ACID, ED: LACTIC ACID, VENOUS: 1.82 mmol/L (ref 0.5–1.9)

## 2017-05-24 LAB — CBC
HCT: 32.8 % — ABNORMAL LOW (ref 39.0–52.0)
HEMOGLOBIN: 10.8 g/dL — AB (ref 13.0–17.0)
MCH: 26 pg (ref 26.0–34.0)
MCHC: 32.9 g/dL (ref 30.0–36.0)
MCV: 78.8 fL (ref 78.0–100.0)
Platelets: 433 10*3/uL — ABNORMAL HIGH (ref 150–400)
RBC: 4.16 MIL/uL — AB (ref 4.22–5.81)
RDW: 14.7 % (ref 11.5–15.5)
WBC: 13.9 10*3/uL — ABNORMAL HIGH (ref 4.0–10.5)

## 2017-05-24 LAB — TYPE AND SCREEN
ABO/RH(D): O POS
ANTIBODY SCREEN: NEGATIVE

## 2017-05-24 LAB — I-STAT TROPONIN, ED: TROPONIN I, POC: 0.01 ng/mL (ref 0.00–0.08)

## 2017-05-24 LAB — MRSA PCR SCREENING: MRSA BY PCR: NEGATIVE

## 2017-05-24 LAB — MAGNESIUM: MAGNESIUM: 1.9 mg/dL (ref 1.7–2.4)

## 2017-05-24 SURGERY — LAPAROTOMY, EXPLORATORY
Anesthesia: General | Site: Abdomen

## 2017-05-24 MED ORDER — VANCOMYCIN HCL 10 G IV SOLR
2000.0000 mg | Freq: Once | INTRAVENOUS | Status: AC
  Administered 2017-05-24: 2000 mg via INTRAVENOUS
  Filled 2017-05-24: qty 2000

## 2017-05-24 MED ORDER — ENOXAPARIN SODIUM 40 MG/0.4ML ~~LOC~~ SOLN
40.0000 mg | SUBCUTANEOUS | Status: DC
  Administered 2017-05-24 – 2017-06-03 (×11): 40 mg via SUBCUTANEOUS
  Filled 2017-05-24 (×11): qty 0.4

## 2017-05-24 MED ORDER — SUCCINYLCHOLINE CHLORIDE 20 MG/ML IJ SOLN
INTRAMUSCULAR | Status: DC | PRN
  Administered 2017-05-24: 140 mg via INTRAVENOUS

## 2017-05-24 MED ORDER — LIDOCAINE HCL (CARDIAC) 20 MG/ML IV SOLN
INTRAVENOUS | Status: DC | PRN
  Administered 2017-05-24: 40 mg via INTRAVENOUS

## 2017-05-24 MED ORDER — IOPAMIDOL (ISOVUE-300) INJECTION 61%
INTRAVENOUS | Status: AC
  Administered 2017-05-24: 100 mL
  Filled 2017-05-24: qty 100

## 2017-05-24 MED ORDER — ONDANSETRON HCL 4 MG/2ML IJ SOLN: 4.0000 mg | Freq: Once | INTRAMUSCULAR | Status: DC | PRN

## 2017-05-24 MED ORDER — PIPERACILLIN-TAZOBACTAM 3.375 G IVPB
3.3750 g | Freq: Three times a day (TID) | INTRAVENOUS | Status: DC
  Administered 2017-05-24 – 2017-06-02 (×26): 3.375 g via INTRAVENOUS
  Filled 2017-05-24 (×30): qty 50

## 2017-05-24 MED ORDER — DEXTROSE 5 % IV SOLN
INTRAVENOUS | Status: DC | PRN
  Administered 2017-05-24: 20 ug/min via INTRAVENOUS

## 2017-05-24 MED ORDER — SODIUM CHLORIDE 0.9 % IV BOLUS (SEPSIS)
500.0000 mL | Freq: Once | INTRAVENOUS | Status: AC
Start: 2017-05-24 — End: 2017-05-24
  Administered 2017-05-24: 500 mL via INTRAVENOUS

## 2017-05-24 MED ORDER — FENTANYL CITRATE (PF) 100 MCG/2ML IJ SOLN
INTRAMUSCULAR | Status: DC | PRN
  Administered 2017-05-24: 150 ug via INTRAVENOUS
  Administered 2017-05-24: 100 ug via INTRAVENOUS
  Administered 2017-05-24 (×3): 50 ug via INTRAVENOUS

## 2017-05-24 MED ORDER — PIPERACILLIN-TAZOBACTAM 3.375 G IVPB 30 MIN
3.3750 g | Freq: Once | INTRAVENOUS | Status: AC
  Administered 2017-05-24: 3.375 g via INTRAVENOUS
  Filled 2017-05-24: qty 50

## 2017-05-24 MED ORDER — MORPHINE SULFATE 2 MG/ML IV SOLN
INTRAVENOUS | Status: DC
  Administered 2017-05-24: 12:00:00 via INTRAVENOUS
  Administered 2017-05-24 – 2017-05-25 (×2): 9 mg via INTRAVENOUS
  Administered 2017-05-25: 8.59 mg via INTRAVENOUS
  Administered 2017-05-25 (×3): 6 mg via INTRAVENOUS
  Administered 2017-05-25: 07:00:00 via INTRAVENOUS
  Administered 2017-05-25 – 2017-05-26 (×3): 6 mg via INTRAVENOUS
  Administered 2017-05-26: 07:00:00 via INTRAVENOUS
  Administered 2017-05-26: 7.5 mg via INTRAVENOUS
  Administered 2017-05-26: 9 mg via INTRAVENOUS
  Administered 2017-05-27: 4.63 mg via INTRAVENOUS
  Administered 2017-05-27: 14:00:00 via INTRAVENOUS
  Administered 2017-05-27: 3 mg via INTRAVENOUS
  Administered 2017-05-27: 10.5 mg via INTRAVENOUS
  Administered 2017-05-27: 4.5 mg via INTRAVENOUS
  Administered 2017-05-27: 4.36 mg via INTRAVENOUS
  Administered 2017-05-28 (×2): 3 mg via INTRAVENOUS
  Administered 2017-05-28: 1.5 mg via INTRAVENOUS
  Administered 2017-05-29: 0 mg via INTRAVENOUS
  Administered 2017-05-29: 3 mg via INTRAVENOUS
  Administered 2017-05-29: 12:00:00 via INTRAVENOUS
  Administered 2017-05-30: 3 mg via INTRAVENOUS
  Administered 2017-05-30: 1 mg via INTRAVENOUS
  Filled 2017-05-24 (×6): qty 30

## 2017-05-24 MED ORDER — PROPOFOL 10 MG/ML IV BOLUS
INTRAVENOUS | Status: DC | PRN
  Administered 2017-05-24: 140 mg via INTRAVENOUS

## 2017-05-24 MED ORDER — VANCOMYCIN HCL IN DEXTROSE 1-5 GM/200ML-% IV SOLN
1000.0000 mg | Freq: Two times a day (BID) | INTRAVENOUS | Status: DC
  Administered 2017-05-24 – 2017-05-28 (×8): 1000 mg via INTRAVENOUS
  Filled 2017-05-24 (×10): qty 200

## 2017-05-24 MED ORDER — PHENYLEPHRINE HCL 10 MG/ML IJ SOLN
INTRAMUSCULAR | Status: DC | PRN
  Administered 2017-05-24: 80 ug via INTRAVENOUS

## 2017-05-24 MED ORDER — DIPHENHYDRAMINE HCL 50 MG/ML IJ SOLN: 12.5000 mg | Freq: Four times a day (QID) | INTRAMUSCULAR | Status: DC | PRN

## 2017-05-24 MED ORDER — PROPOFOL 10 MG/ML IV BOLUS
INTRAVENOUS | Status: AC
  Filled 2017-05-24: qty 20

## 2017-05-24 MED ORDER — SODIUM CHLORIDE 0.9 % IV BOLUS (SEPSIS)
1000.0000 mL | Freq: Once | INTRAVENOUS | Status: AC
  Administered 2017-05-24: 1000 mL via INTRAVENOUS

## 2017-05-24 MED ORDER — 0.9 % SODIUM CHLORIDE (POUR BTL) OPTIME
TOPICAL | Status: DC | PRN
  Administered 2017-05-24: 3000 mL

## 2017-05-24 MED ORDER — ONDANSETRON HCL 4 MG/2ML IJ SOLN: 4.0000 mg | Freq: Four times a day (QID) | INTRAMUSCULAR | Status: DC | PRN

## 2017-05-24 MED ORDER — FENTANYL CITRATE (PF) 250 MCG/5ML IJ SOLN
INTRAMUSCULAR | Status: AC
  Filled 2017-05-24: qty 5

## 2017-05-24 MED ORDER — ONDANSETRON HCL 4 MG/2ML IJ SOLN
INTRAMUSCULAR | Status: DC | PRN
  Administered 2017-05-24: 4 mg via INTRAVENOUS

## 2017-05-24 MED ORDER — SUGAMMADEX SODIUM 200 MG/2ML IV SOLN
INTRAVENOUS | Status: DC | PRN
  Administered 2017-05-24: 319.2 mg via INTRAVENOUS

## 2017-05-24 MED ORDER — DIPHENHYDRAMINE HCL 12.5 MG/5ML PO ELIX
12.5000 mg | ORAL_SOLUTION | Freq: Four times a day (QID) | ORAL | Status: DC | PRN
  Filled 2017-05-24: qty 5

## 2017-05-24 MED ORDER — SODIUM CHLORIDE 0.9% FLUSH: 9.0000 mL | INTRAVENOUS | Status: DC | PRN

## 2017-05-24 MED ORDER — FENTANYL CITRATE (PF) 100 MCG/2ML IJ SOLN
INTRAMUSCULAR | Status: AC
  Administered 2017-05-24: 50 ug via INTRAVENOUS
  Filled 2017-05-24: qty 2

## 2017-05-24 MED ORDER — FENTANYL CITRATE (PF) 100 MCG/2ML IJ SOLN
25.0000 ug | INTRAMUSCULAR | Status: DC | PRN
  Administered 2017-05-24 (×3): 50 ug via INTRAVENOUS

## 2017-05-24 MED ORDER — FENTANYL CITRATE (PF) 100 MCG/2ML IJ SOLN
INTRAMUSCULAR | Status: AC
  Filled 2017-05-24: qty 2

## 2017-05-24 MED ORDER — LACTATED RINGERS IV SOLN
INTRAVENOUS | Status: DC | PRN
  Administered 2017-05-24 (×3): via INTRAVENOUS

## 2017-05-24 MED ORDER — VANCOMYCIN HCL IN DEXTROSE 1-5 GM/200ML-% IV SOLN: 1000.0000 mg | Freq: Once | INTRAVENOUS | Status: DC

## 2017-05-24 MED ORDER — ESMOLOL HCL 100 MG/10ML IV SOLN
INTRAVENOUS | Status: DC | PRN
  Administered 2017-05-24 (×2): 20 mg via INTRAVENOUS

## 2017-05-24 MED ORDER — ROCURONIUM BROMIDE 100 MG/10ML IV SOLN
INTRAVENOUS | Status: DC | PRN
  Administered 2017-05-24: 50 mg via INTRAVENOUS
  Administered 2017-05-24: 20 mg via INTRAVENOUS
  Administered 2017-05-24: 50 mg via INTRAVENOUS

## 2017-05-24 MED ORDER — HYDROMORPHONE HCL 1 MG/ML IJ SOLN
INTRAMUSCULAR | Status: AC
  Filled 2017-05-24: qty 2

## 2017-05-24 MED ORDER — IOPAMIDOL (ISOVUE-300) INJECTION 61%
INTRAVENOUS | Status: AC
  Filled 2017-05-24: qty 30

## 2017-05-24 MED ORDER — SODIUM CHLORIDE 0.9 % IV SOLN
1000.0000 mL | INTRAVENOUS | Status: DC
  Administered 2017-05-24: 1000 mL via INTRAVENOUS

## 2017-05-24 MED ORDER — POTASSIUM CHLORIDE IN NACL 20-0.9 MEQ/L-% IV SOLN
INTRAVENOUS | Status: DC
  Administered 2017-05-24 – 2017-05-26 (×4): via INTRAVENOUS
  Filled 2017-05-24 (×6): qty 1000

## 2017-05-24 MED ORDER — NALOXONE HCL 0.4 MG/ML IJ SOLN: 0.4000 mg | INTRAMUSCULAR | Status: DC | PRN

## 2017-05-24 MED ORDER — FENTANYL CITRATE (PF) 100 MCG/2ML IJ SOLN
50.0000 ug | INTRAMUSCULAR | Status: DC | PRN
  Administered 2017-05-24 (×2): 50 ug via INTRAVENOUS
  Filled 2017-05-24 (×2): qty 2

## 2017-05-24 SURGICAL SUPPLY — 47 items
BLADE CLIPPER SURG (BLADE) IMPLANT
BNDG GAUZE ELAST 4 BULKY (GAUZE/BANDAGES/DRESSINGS) ×3 IMPLANT
CANISTER SUCT 3000ML PPV (MISCELLANEOUS) ×3 IMPLANT
CATH ROBINSON RED A/P 12FR (CATHETERS) ×3 IMPLANT
COVER SURGICAL LIGHT HANDLE (MISCELLANEOUS) ×3 IMPLANT
DRAPE LAPAROSCOPIC ABDOMINAL (DRAPES) ×3 IMPLANT
DRAPE WARM FLUID 44X44 (DRAPE) ×3 IMPLANT
DRSG OPSITE POSTOP 4X10 (GAUZE/BANDAGES/DRESSINGS) IMPLANT
DRSG OPSITE POSTOP 4X8 (GAUZE/BANDAGES/DRESSINGS) IMPLANT
ELECT BLADE 6.5 EXT (BLADE) IMPLANT
ELECT CAUTERY BLADE 6.4 (BLADE) ×3 IMPLANT
ELECT REM PT RETURN 9FT ADLT (ELECTROSURGICAL) ×3
ELECTRODE REM PT RTRN 9FT ADLT (ELECTROSURGICAL) ×1 IMPLANT
GLOVE BIOGEL M STRL SZ7.5 (GLOVE) ×3 IMPLANT
GLOVE BIOGEL PI IND STRL 7.0 (GLOVE) ×2 IMPLANT
GLOVE BIOGEL PI IND STRL 8 (GLOVE) ×3 IMPLANT
GLOVE BIOGEL PI INDICATOR 7.0 (GLOVE) ×4
GLOVE BIOGEL PI INDICATOR 8 (GLOVE) ×6
GLOVE SURG SS PI 7.0 STRL IVOR (GLOVE) ×3 IMPLANT
GOWN STRL REUS W/ TWL LRG LVL3 (GOWN DISPOSABLE) ×1 IMPLANT
GOWN STRL REUS W/TWL 2XL LVL3 (GOWN DISPOSABLE) ×3 IMPLANT
GOWN STRL REUS W/TWL LRG LVL3 (GOWN DISPOSABLE) ×2
KIT BASIN OR (CUSTOM PROCEDURE TRAY) ×3 IMPLANT
KIT REMOVER STAPLE SKIN (MISCELLANEOUS) ×3 IMPLANT
KIT ROOM TURNOVER OR (KITS) ×3 IMPLANT
LIGASURE IMPACT 36 18CM CVD LR (INSTRUMENTS) IMPLANT
NS IRRIG 1000ML POUR BTL (IV SOLUTION) ×6 IMPLANT
PACK GENERAL/GYN (CUSTOM PROCEDURE TRAY) ×3 IMPLANT
PAD ARMBOARD 7.5X6 YLW CONV (MISCELLANEOUS) ×3 IMPLANT
SPECIMEN JAR LARGE (MISCELLANEOUS) IMPLANT
SPONGE LAP 18X18 X RAY DECT (DISPOSABLE) ×12 IMPLANT
STAPLER VISISTAT 35W (STAPLE) ×3 IMPLANT
SUCTION POOLE TIP (SUCTIONS) ×3 IMPLANT
SUT ETHILON 2 0 FS 18 (SUTURE) ×3 IMPLANT
SUT PDS AB 1 TP1 96 (SUTURE) ×6 IMPLANT
SUT SILK 2 0 SH (SUTURE) ×3 IMPLANT
SUT SILK 2 0 SH CR/8 (SUTURE) ×3 IMPLANT
SUT SILK 2 0 TIES 10X30 (SUTURE) ×3 IMPLANT
SUT SILK 3 0 SH CR/8 (SUTURE) ×9 IMPLANT
SUT SILK 3 0 TIES 10X30 (SUTURE) ×3 IMPLANT
SUT VIC AB 3-0 SH 18 (SUTURE) IMPLANT
SUT VIC AB 3-0 SH 8-18 (SUTURE) ×3 IMPLANT
SWAB COLLECTION DEVICE MRSA (MISCELLANEOUS) ×3 IMPLANT
TOWEL OR 17X26 10 PK STRL BLUE (TOWEL DISPOSABLE) ×3 IMPLANT
TRAY FOLEY W/METER SILVER 16FR (SET/KITS/TRAYS/PACK) ×3 IMPLANT
TUBE ANAEROBIC SPECIMEN COL (MISCELLANEOUS) ×3 IMPLANT
YANKAUER SUCT BULB TIP NO VENT (SUCTIONS) IMPLANT

## 2017-05-24 NOTE — Progress Notes (Signed)
Pharmacy Antibiotic Note  Douglas Warren is a 66 y.o. male admitted on 05/23/2017 with sepsis.  Pharmacy has been consulted for Vancocin and Zosyn dosing.  Plan: Vancomycin 2000mg  x1 then 1000mg  IV every 12 hours.  Goal trough 15-20 mcg/mL. Zosyn 3.375g IV q8h (4 hour infusion).   Temp (24hrs), Avg:98.3 F (36.8 C), Min:98.3 F (36.8 C), Max:98.3 F (36.8 C)   Recent Labs Lab 05/18/17 0836 05/23/17 2316 05/24/17 0012  WBC 6.4  --   --   CREATININE 0.69 0.96  --   LATICACIDVEN  --   --  1.82    Estimated Creatinine Clearance: 76.7 mL/min (by C-G formula based on SCr of 0.96 mg/dL).    No Known Allergies   Thank you for allowing pharmacy to be a part of this patient's care.  Wynona Neat, PharmD, BCPS  05/24/2017 12:32 AM

## 2017-05-24 NOTE — H&P (Signed)
Douglas Warren is an 66 y.o. male.   Chief Complaint: abdominal pain HPI: 66 year old white male status post partial right transverse colectomy as well as left colectomy on August 14 by Dr. Hulen Skains for 2 separate colonic lesions comes back in for fever, chills, hypotension and abdominal pain. He was discharged on the 22nd. His wife reports that he has had a very low-grade temperature on Friday evening but otherwise was asymptomatic. They took Tylenol and the fever broke. On Saturday, in the early evening the wife reports that the patient had a drop in his blood pressure, his heart rate increased and he complained of significant right upper quadrant pain which ultimately spread to his lower abdomen. They brought him to the emergency room for evaluation. His last bowel movement was on Friday which was loose. He denies any vomiting but did have some nausea. He reports abdominal distention. He denies any melena or hematochezia.  He does not smoke. He denies any chest pain, chest pressure, shortness of breath, orthopnea, TIAs or amaurosis fugax  Past Medical History:  Diagnosis Date  . CRVO (central retinal vein occlusion)    left eye  . Diverticulosis   . Inguinal hernia    right  . Mass of colon     x 2  . Pyloric stenosis     Past Surgical History:  Procedure Laterality Date  . ABDOMINAL SURGERY     for pyloic stenosis at 6 months  . COLONOSCOPY    . COLONOSCOPY N/A 05/12/2017   Procedure: COLONOSCOPY;  Surgeon: Carol Ada, MD;  Location: [facility];  Service: Endoscopy;  Laterality: N/A;  . HERNIA REPAIR     inguinal left  . PARTIAL COLECTOMY Left 05/12/2017   Procedure: LEFT COLECTOMY;  Surgeon: Judeth Horn, MD;  Location: [facility];  Service: General;  Laterality: Left;  . PROCTOSCOPY N/A 05/12/2017   Procedure: PROCTOSCOPY;  Surgeon: Judeth Horn, MD;  Location: [facility];  Service: General;  Laterality: N/A;  . TONSILLECTOMY    . WISDOM TOOTH EXTRACTION      History reviewed. No pertinent  family history. Social History:  reports that he has never smoked. He has never used smokeless tobacco. He reports that he drinks about 0.6 oz of alcohol per week . He reports that he does not use drugs.  Allergies: No Known Allergies   (Not in a hospital admission)  Results for orders placed or performed during the hospital encounter of 05/23/17 (from the past 48 hour(s))  Urinalysis, Routine w reflex microscopic     Status: Abnormal   Collection Time: 05/23/17 11:10 PM  Result Value Ref Range   Color, Urine YELLOW YELLOW   APPearance CLOUDY (A) CLEAR   Specific Gravity, Urine 1.026 1.005 - 1.030   pH 5.0 5.0 - 8.0   Glucose, UA NEGATIVE NEGATIVE mg/dL   Hgb urine dipstick NEGATIVE NEGATIVE   Bilirubin Urine NEGATIVE NEGATIVE   Ketones, ur 20 (A) NEGATIVE mg/dL   Protein, ur 30 (A) NEGATIVE mg/dL   Nitrite NEGATIVE NEGATIVE   Leukocytes, UA NEGATIVE NEGATIVE   RBC / HPF 0-5 0 - 5 RBC/hpf   WBC, UA 0-5 0 - 5 WBC/hpf   Bacteria, UA RARE (A) NONE SEEN   Squamous Epithelial / LPF NONE SEEN NONE SEEN   Mucus PRESENT    Hyaline Casts, UA PRESENT   Comprehensive metabolic panel     Status: Abnormal   Collection Time: 05/23/17 11:16 PM  Result Value Ref Range   Sodium 133 (  L) 135 - 145 mmol/L   Potassium 3.9 3.5 - 5.1 mmol/L   Chloride 96 (L) 101 - 111 mmol/L   CO2 25 22 - 32 mmol/L   Glucose, Bld 124 (H) 65 - 99 mg/dL   BUN 19 6 - 20 mg/dL   Creatinine, Ser 0.96 0.61 - 1.24 mg/dL   Calcium 8.8 (L) 8.9 - 10.3 mg/dL   Total Protein 6.7 6.5 - 8.1 g/dL   Albumin 2.7 (L) 3.5 - 5.0 g/dL   AST 16 15 - 41 U/L   ALT 37 17 - 63 U/L   Alkaline Phosphatase 66 38 - 126 U/L   Total Bilirubin 1.2 0.3 - 1.2 mg/dL   GFR calc non Af Amer >60 >60 mL/min   GFR calc Af Amer >60 >60 mL/min    Comment: (NOTE) The eGFR has been calculated using the CKD EPI equation. This calculation has not been validated in all clinical situations. eGFR's persistently <60 mL/min signify possible Chronic  Kidney Disease.    Anion gap 12 5 - 15  CBC with Differential     Status: Abnormal   Collection Time: 05/23/17 11:16 PM  Result Value Ref Range   WBC 20.1 (H) 4.0 - 10.5 K/uL   RBC 4.48 4.22 - 5.81 MIL/uL   Hemoglobin 11.7 (L) 13.0 - 17.0 g/dL   HCT 34.8 (L) 39.0 - 52.0 %   MCV 77.7 (L) 78.0 - 100.0 fL   MCH 26.1 26.0 - 34.0 pg   MCHC 33.6 30.0 - 36.0 g/dL   RDW 14.5 11.5 - 15.5 %   Platelets 531 (H) 150 - 400 K/uL   Neutrophils Relative % 96 %   Neutro Abs 19.3 (H) 1.7 - 7.7 K/uL   Lymphocytes Relative 2 %   Lymphs Abs 0.3 (L) 0.7 - 4.0 K/uL   Monocytes Relative 2 %   Monocytes Absolute 0.4 0.1 - 1.0 K/uL   Eosinophils Relative 0 %   Eosinophils Absolute 0.0 0.0 - 0.7 K/uL   Basophils Relative 0 %   Basophils Absolute 0.0 0.0 - 0.1 K/uL  I-stat troponin, ED     Status: None   Collection Time: 05/23/17 11:57 PM  Result Value Ref Range   Troponin i, poc 0.01 0.00 - 0.08 ng/mL   Comment 3            Comment: Due to the release kinetics of cTnI, a negative result within the first hours of the onset of symptoms does not rule out myocardial infarction with certainty. If myocardial infarction is still suspected, repeat the test at appropriate intervals.   I-Stat CG4 Lactic Acid, ED     Status: None   Collection Time: 05/24/17 12:12 AM  Result Value Ref Range   Lactic Acid, Venous 1.82 0.5 - 1.9 mmol/L   Dg Chest 2 View  Result Date: 05/24/2017 CLINICAL DATA:  Fever, abdominal pain, recent discharge from hospital on 05/20/2017 for left hemicolectomy performed 05/12/2017. EXAM: CHEST  2 VIEW COMPARISON:  None. FINDINGS: Crescentic air like lucencies are seen beneath the diaphragm consistent with free intraperitoneal air. Given the patient's history of prior left hemicolectomy on 05/12/2017, these findings should have resolved by this time. CT with IV and oral contrast is therefore recommended for better correlation and to evaluate for possible site of leak and potential  abscesses. Heart size is normal. There is mild uncoiling of the thoracic aorta without aneurysm. There is bibasilar atelectasis. IMPRESSION: 1. There appears be a small amount of free  air beneath the diaphragm. The patient underwent left hemicolectomy on 05/12/2017 and findings should have resolved by now. CT is recommended with IV and oral contrast for better assessment. These results were called by telephone at the time of interpretation on 05/24/2017 at 12:08 am to Dr. Noemi Chapel , who verbally acknowledged these results. 2. Bibasilar atelectasis is noted. Electronically Signed   By: Ashley Royalty M.D.   On: 05/24/2017 00:09   Ct Abdomen Pelvis W Contrast  Result Date: 05/24/2017 CLINICAL DATA:  Abdominal pain. Right upper quadrant pain and fever. Left hemicolectomy 12 days prior. EXAM: CT ABDOMEN AND PELVIS WITH CONTRAST TECHNIQUE: Multidetector CT imaging of the abdomen and pelvis was performed using the standard protocol following bolus administration of intravenous contrast. CONTRAST:  134m ISOVUE-300 IOPAMIDOL (ISOVUE-300) INJECTION 61% COMPARISON:  Radiograph 05/16/2017 FINDINGS: Lower chest: Lung base consolidations in the right and left lower lobe and lingula. Small left pleural effusion. Hepatobiliary: No focal liver abnormality is seen. No gallstones or biliary dilatation. Fluid adjacent to the gallbladder limits assessment for gallbladder wall thickening. Pancreas: No ductal dilatation or inflammation. Spleen: Normal in size without focal abnormality. Adrenals/Urinary Tract: Normal adrenal glands. No hydronephrosis or perinephric edema. Symmetric excretion on delayed phase imaging. Urinary bladder is physiologically distended. Stomach/Bowel: Large volume of free air in the abdomen and pelvis consistent with bowel perforation. Extraluminal heterogeneous with 5.9 x 3.1 cm fluid collection in the deep pelvis adjacent to the sigmoid anastomotic suture line. The stomach is distended. Proximal small  bowel loops are distended and filled with fluid and enteric contrast. Enteric sutures in the sigmoid colon. Enteric sutures in the transverse colon. Descending colon is fluid-filled with mild wall thickening. No extraluminal enteric contrast. Vascular/Lymphatic: Mild aortic atherosclerosis. No aneurysm. Portal and mesenteric veins are patent without portal venous gas. No evidence of adenopathy. Reproductive: Prostate gland is enlarged spanning 6.8 cm. Other: Moderate to large volume of free air in the abdomen, pelvis and throughout the mesentery. This is unexpected 12 days post recent abdominal surgery. Moderate free fluid prominently in the pelvis. Small pericolonic abscess in the presacral region adjacent to the sigmoid enteric sutures 5.9 x 3.1 cm. There is mesenteric and omental edema in the anterior lower abdomen. Midline skin staples. Fluid and edema track into the right inguinal canal. Musculoskeletal: There are no acute or suspicious osseous abnormalities. IMPRESSION: 1. Moderate-large volume free intra-abdominal air, scattered throughout the abdomen and pelvis. Suspect site of perforation is at the a sigmoid anastomosis as there is an adjacent presacral abscess measuring 5.8 x 3.1 cm. 2. Probable reactive ileus with dilated fluid in contrast filled small bowel. Critical Value/emergent results were called by telephone at the time of interpretation on 05/24/2017 at 3:41 am to Dr. KPryor Curia, who verbally acknowledged these results. Electronically Signed   By: MJeb LeveringM.D.   On: 05/24/2017 03:42    Review of Systems  Constitutional: Positive for chills and fever. Negative for weight loss.  HENT: Negative for nosebleeds.   Eyes: Negative for blurred vision.  Respiratory: Negative for shortness of breath.   Cardiovascular: Negative for chest pain, palpitations, orthopnea and PND.       Denies DOE  Gastrointestinal: Positive for abdominal pain and nausea.  Genitourinary: Negative for dysuria  and hematuria.  Musculoskeletal: Negative.   Skin: Negative for itching and rash.  Neurological: Negative for dizziness, focal weakness, seizures, loss of consciousness and headaches.       Denies TIAs, amaurosis fugax  Endo/Heme/Allergies: Does not  bruise/bleed easily.  Psychiatric/Behavioral: The patient is not nervous/anxious.     Blood pressure 127/84, pulse (!) 116, temperature 98.3 F (36.8 C), temperature source Oral, resp. rate 16, height '5\' 9"'  (1.753 m), weight 79.8 kg (176 lb), SpO2 93 %. Physical Exam  Vitals reviewed. Constitutional: He is oriented to person, place, and time. He appears well-developed and well-nourished. He appears ill. No distress.  HENT:  Head: Normocephalic and atraumatic.  Right Ear: External ear normal.  Left Ear: External ear normal.  Eyes: Conjunctivae are normal. No scleral icterus.  Neck: Normal range of motion. Neck supple. No tracheal deviation present. No thyromegaly present.  Cardiovascular: Normal heart sounds and intact distal pulses.  Tachycardia present.   Tachy; slightly irregular  Respiratory: Effort normal and breath sounds normal. No stridor. No respiratory distress. He has no wheezes.  GI: There is generalized tenderness. There is guarding. There is no rigidity.  Distended abdomen, staples intact, generalized tenderness; voluntary guarding.   Musculoskeletal: He exhibits no edema or tenderness.  Lymphadenopathy:    He has no cervical adenopathy.  Neurological: He is alert and oriented to person, place, and time. He exhibits normal muscle tone.  Skin: Skin is warm and dry. No rash noted. He is not diaphoretic. No erythema. No pallor.  Psychiatric: He has a normal mood and affect. His behavior is normal. Judgment and thought content normal.     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 Hulen Skains  SIRS Anemia  It  appears the patient has a leak at one of his anastomoses, Most probably the rectosigmoid anastomosis  Regardless of the location, he needs urgent exploratory laparotomy, washout, and diversion with ostomy. This was explained in detail to the patient, his wife and son. I explained the rationale why repairing the leak/redoing the anastomosis is not recommended in this clinical situation.  I discussed the procedure in detail.   We discussed the risks and benefits of surgery including, but not limited to bleeding, infection (such as wound infection, abdominal abscess), injury to surrounding structures, blood clot formation, urinary retention, incisional hernia, anastomotic stricture, anastomotic leak, anesthesia risks, pulmonary & cardiac complications such as pneumonia &/or heart attack, need for additional procedures, ileus, ostomy complications & prolonged hospitalization.  We discussed the typical postoperative recovery course, including limitations & restrictions postoperatively. I explained that the likelihood of improvement in their symptoms is good but may have protracted hospital course.   Did advise family that pt may become sicker (sepsis) and be on ventilator after surgery  Cont broad spectrum abx Type and screen SDU vs ICU postop depending on intra-op course  Douglas Warren M. Redmond Pulling, MD, FACS General, Bariatric, & Minimally Invasive Surgery [facility], [place]    Douglas Curry, MD 05/24/2017, 5:19 AM

## 2017-05-24 NOTE — Anesthesia Preprocedure Evaluation (Signed)
Anesthesia Evaluation  Patient identified by MRN, date of birth, ID band Patient awake    Reviewed: Allergy & Precautions, NPO status , Patient's Chart, lab work & pertinent test results  Airway Mallampati: II  TM Distance: >3 FB Neck ROM: Full    Dental  (+) Teeth Intact, Dental Advisory Given   Pulmonary    breath sounds clear to auscultation       Cardiovascular  Rhythm:Irregular Rate:Tachycardia     Neuro/Psych    GI/Hepatic   Endo/Other    Renal/GU      Musculoskeletal   Abdominal (+)  Abdomen: tender.    Peds  Hematology   Anesthesia Other Findings   Reproductive/Obstetrics                             Anesthesia Physical Anesthesia Plan  ASA: IV and emergent  Anesthesia Plan: General   Post-op Pain Management:    Induction: Intravenous, Rapid sequence and Cricoid pressure planned  PONV Risk Score and Plan: Dexamethasone and Ondansetron  Airway Management Planned: Oral ETT  Additional Equipment:   Intra-op Plan:   Post-operative Plan: Possible Post-op intubation/ventilation  Informed Consent: I have reviewed the patients History and Physical, chart, labs and discussed the procedure including the risks, benefits and alternatives for the proposed anesthesia with the patient or authorized representative who has indicated his/her understanding and acceptance.   Dental advisory given  Plan Discussed with: CRNA and Anesthesiologist  Anesthesia Plan Comments:         Anesthesia Quick Evaluation

## 2017-05-24 NOTE — ED Provider Notes (Signed)
TIME SEEN: 12:16 AM  CHIEF COMPLAINT: Abdominal pain, fever  HPI: Patient is a 66 year old male with history of diverticulosis, previous right inguinal hernia, recent left sided colectomy and partial right transverse colectomy by Dr. Hulen Skains and Dr. Dalbert Batman on August 14 for mass is noted to the rectosigmoid and descending colon who presents emergency department fever that started on Friday. States it has been as high as 101.4 at home. Has had nausea but no vomiting. Is also had diarrhea. No cough, chest pain or shortness of breath. He feels his abdomen is distended. Family became concerned because he was tachycardic and hypotensive at home. They called the general surgeon on call who instructed them to come to the hospital.  ROS: See HPI Constitutional: fever  Eyes: no drainage  ENT: no runny nose   Cardiovascular:  no chest pain  Resp: no SOB  GI: no vomiting GU: no dysuria Integumentary: no rash  Allergy: no hives  Musculoskeletal: no leg swelling  Neurological: no slurred speech ROS otherwise negative  PAST MEDICAL HISTORY/PAST SURGICAL HISTORY:  Past Medical History:  Diagnosis Date  . CRVO (central retinal vein occlusion)    left eye  . Diverticulosis   . Inguinal hernia    right  . Mass of colon     x 2  . Pyloric stenosis     MEDICATIONS:  Prior to Admission medications   Medication Sig Start Date End Date Taking? Authorizing Provider  cholecalciferol (VITAMIN D) 1000 units tablet Take 1,000 Units by mouth daily.    [provider]  gabapentin (NEURONTIN) 300 MG capsule Take 1 capsule (300 mg total) by mouth 2 (two) times daily. 05/20/17   Judeth Horn, MD  metoprolol tartrate (LOPRESSOR) 25 MG tablet Take 0.5 tablets (12.5 mg total) by mouth 2 (two) times daily. 05/20/17   Judeth Horn, MD  ondansetron (ZOFRAN) 4 MG tablet Take 1 tablet (4 mg total) by mouth every 8 (eight) hours as needed for nausea. 05/20/17   Judeth Horn, MD  oxyCODONE (OXY IR/ROXICODONE) 5 MG  immediate release tablet Take 1 tablet (5 mg total) by mouth every 4 (four) hours as needed for moderate pain or severe pain. 05/20/17   Judeth Horn, MD  Probiotic Product (PROBIOTIC DAILY PO) Take 1 capsule by mouth daily.    [provider]    ALLERGIES:  No Known Allergies  SOCIAL HISTORY:  Social History  Substance Use Topics  . Smoking status: Never Smoker  . Smokeless tobacco: Never Used  . Alcohol use 0.6 oz/week    1 Glasses of wine per week     Comment: > twice a week    FAMILY HISTORY: History reviewed. No pertinent family history.  EXAM: BP 109/79 (BP Location: Right Arm)   Pulse (!) 140   Temp 98.3 F (36.8 C) (Oral)   Resp 18   SpO2 94%  CONSTITUTIONAL: Alert and oriented and responds appropriately to questions. Well-appearing; well-nourished HEAD: Normocephalic EYES: Conjunctivae clear, pupils appear equal, EOMI ENT: normal nose; moist mucous membranes NECK: Supple, no meningismus, no nuchal rigidity, no LAD  CARD: RRR; S1 and S2 appreciated; no murmurs, no clicks, no rubs, no gallops RESP: Normal chest excursion without splinting or tachypnea; breath sounds clear and equal bilaterally; no wheezes, no rhonchi, no rales, no hypoxia or respiratory distress, speaking full sentences ABD/GI: Hypoactive bowel sounds, abdomen is distended and diffusely tender to palpation, he has a large vertical surgical scar in the middle of his abdomen has no erythema, warmth  or drainage BACK:  The back appears normal and is non-tender to palpation, there is no CVA tenderness EXT: Normal ROM in all joints; non-tender to palpation; no edema; normal capillary refill; no cyanosis, no calf tenderness or swelling    SKIN: Normal color for age and race; warm; no rash NEURO: Moves all extremities equally PSYCH: The patient's mood and manner are appropriate. Grooming and personal hygiene are appropriate.  MEDICAL DECISION MAKING: Patient here if fevers, worsening abdominal pain and  distention. We'll obtain CT scan for further evaluation. X-ray shows free air which may be from recent colectomy but also concern for possible perforation. Labs show leukocytosis with left shift but normal lactate. Blood pressure improving with IV hydration. Will give broad spectrum antibiotics.  ED PROGRESS: CT scan shows moderate to large volume free intra-abdominal air with site of perforation likely at the sigmoid anastomosis with an adjacent abscess measuring 5.8 x 3.1 cm. Also has reactive ileus. We'll contact general surgery. Patient and family have been updated with plan. Patient's vital signs are improving.  4:00 AM  D/w Dr. Redmond Pulling with general surgery who will see patient for admission.  Appreciate his help.   I reviewed all nursing notes, vitals, pertinent previous records, EKGs, lab and urine results, imaging (as available).      EKG Interpretation  Date/Time:  Saturday May 23 2017 23:08:27 EDT Ventricular Rate:  143 PR Interval:  148 QRS Duration: 72 QT Interval:  264 QTC Calculation: 407 R Axis:   14 Text Interpretation:  Sinus tachycardia with Premature atrial complexes Possible Inferior infarct , age undetermined Cannot rule out Anterior infarct , age undetermined Abnormal ECG No old tracing to compare Confirmed by Noemi Chapel [phone]) on 05/23/2017 11:46:16 PM        CRITICAL CARE Performed by: Nyra Jabs   Total critical care time: 65 minutes  Critical care time was exclusive of separately billable procedures and treating other patients.  Critical care was necessary to treat or prevent imminent or life-threatening deterioration.  Critical care was time spent personally by me on the following activities: development of treatment plan with patient and/or surrogate as well as nursing, discussions with consultants, evaluation of patient's response to treatment, examination of patient, obtaining history from patient or surrogate, ordering and performing  treatments and interventions, ordering and review of laboratory studies, ordering and review of radiographic studies, pulse oximetry and re-evaluation of patient's condition.     Ward, Delice Bison, DO 05/24/17 [phone]

## 2017-05-24 NOTE — Anesthesia Postprocedure Evaluation (Signed)
Anesthesia Post Note  Patient: Douglas Warren  Procedure(s) Performed: Procedure(s) (LRB): EXPLORATORY LAPAROTOMY AND LOOP ILEOSTOMY (N/A)     Patient location during evaluation: PACU Anesthesia Type: General Level of consciousness: awake and alert Pain management: pain level controlled Vital Signs Assessment: post-procedure vital signs reviewed and stable Respiratory status: spontaneous breathing, nonlabored ventilation, respiratory function stable and patient connected to nasal cannula oxygen Cardiovascular status: blood pressure returned to baseline and stable Postop Assessment: no signs of nausea or vomiting Anesthetic complications: no    Last Vitals:  Vitals:   05/24/17 1300 05/24/17 1615  BP: (!) 131/98   Pulse: (!) 112   Resp: 16   Temp:  37.1 C  SpO2: 98%     Last Pain:  Vitals:   05/24/17 1615  TempSrc: Oral  PainSc:                  Douglas Warren

## 2017-05-24 NOTE — Progress Notes (Signed)
Pt agitated - pulling at Foley- pulled NGT out- replaced by T. Citty, RN to R nare - KUB ordered- will document measurements once KUB read

## 2017-05-24 NOTE — Anesthesia Procedure Notes (Signed)
Arterial Line Insertion Start/End8/26/2018 6:30 AM, 05/24/2017 6:35 AM Performed by: Linna Caprice DAVID, anesthesiologist  Patient location: OR. Preanesthetic checklist: patient identified Left, radial was placed Catheter size: 20 G Hand hygiene performed  and maximum sterile barriers used   Attempts: 1 Procedure performed without using ultrasound guided technique. Ultrasound Notes:anatomy identified

## 2017-05-24 NOTE — Progress Notes (Signed)
NIBp not correlating with A line - will monitor A line for BP readings (90mmHG difference)

## 2017-05-24 NOTE — Transfer of Care (Signed)
Immediate Anesthesia Transfer of Care Note  Patient: Douglas Warren  Procedure(s) Performed: Procedure(s): EXPLORATORY LAPAROTOMY AND LOOP ILEOSTOMY (N/A)  Patient Location: PACU  Anesthesia Type:General  Level of Consciousness: awake, alert  and oriented  Airway & Oxygen Therapy: Patient Spontanous Breathing and Patient connected to face mask oxygen  Post-op Assessment: Report given to RN and Post -op Vital signs reviewed and stable  Post vital signs: Reviewed and stable  Last Vitals:  Vitals:   05/24/17 0430 05/24/17 0500  BP: (!) 132/96 127/84  Pulse: (!) 115 (!) 116  Resp: 15 16  Temp:    SpO2: 93% 93%    Last Pain:  Vitals:   05/23/17 2357  TempSrc:   PainSc: 3          Complications: No apparent anesthesia complications

## 2017-05-24 NOTE — Anesthesia Procedure Notes (Signed)
Procedure Name: Intubation Date/Time: 05/24/2017 6:24 AM Performed by: Manuela Schwartz B Pre-anesthesia Checklist: Patient identified, Emergency Drugs available, Suction available and Patient being monitored Patient Re-evaluated:Patient Re-evaluated prior to induction Oxygen Delivery Method: Circle System Utilized Preoxygenation: Pre-oxygenation with 100% oxygen Induction Type: IV induction, Rapid sequence and Cricoid Pressure applied Laryngoscope Size: Mac and 3 Grade View: Grade I Tube type: Subglottic suction tube Tube size: 8.0 mm Number of attempts: 1 Airway Equipment and Method: Stylet and Oral airway Placement Confirmation: ETT inserted through vocal cords under direct vision,  positive ETCO2 and breath sounds checked- equal and bilateral Secured at: 22 cm Tube secured with: Tape Dental Injury: Teeth and Oropharynx as per pre-operative assessment

## 2017-05-24 NOTE — Progress Notes (Signed)
Labs drawn off A line by T, Citty, RN- CBC, Magnesium & BMP -tubed to lab

## 2017-05-24 NOTE — Brief Op Note (Signed)
05/23/2017 - 05/24/2017  9:14 AM  PATIENT:  Douglas Warren  66 y.o. male  PRE-OPERATIVE DIAGNOSIS:  BOWEL PERFORATION; s/p Left colectomy and proximal right transverse colectomy 05/12/2017  POST-OPERATIVE DIAGNOSIS:  Anastomotic PERFORATION of colorectal anastomosis  PROCEDURE:  Procedure(s): EXPLORATORY LAPAROTOMY AND LOOP ILEOSTOMY (N/A); LYSIS OF ADHESIONS 1.5 HR; REPAIR SMALL BOWEL SEROSAL TEARS X 5  SURGEON:  Surgeon(s) and Role:    Greer Pickerel, MD - Primary    * Fanny Skates, MD - Assisting  PHYSICIAN ASSISTANT:   ASSISTANTS: see above   ANESTHESIA:   general  EBL:  Total I/O In: 1000 [I.V.:1000] Out: 300 [Urine:200; Blood:100]  BLOOD ADMINISTERED:none  DRAINS: (18 fr) Jackson-Pratt drain(s) with closed bulb suction in the pelvis, Nasogastric Tube and Urinary Catheter (Foley)   LOCAL MEDICATIONS USED:  NONE  SPECIMEN:  Intra-abdominal fluid  DISPOSITION OF SPECIMEN:  microbiology  COUNTS:  YES  TOURNIQUET:  * No tourniquets in log *  DICTATION: .Other Dictation: Dictation Number [id number]  PLAN OF CARE: Admit to inpatient   PATIENT DISPOSITION:  PACU - hemodynamically stable.   Delay start of Pharmacological VTE agent (>24hrs) due to surgical blood loss or risk of bleeding: no  Leighton Ruff. Redmond Pulling, MD, FACS General, Bariatric, & Minimally Invasive Surgery [facility], [place]

## 2017-05-24 NOTE — Progress Notes (Signed)
Made Dr. Brantley Stage aware about elevated HR in the 130's-140's. Also, made aware of EKG.

## 2017-05-24 NOTE — Progress Notes (Signed)
Portable KUB done.

## 2017-05-25 ENCOUNTER — Encounter (HOSPITAL_COMMUNITY): Payer: Self-pay | Admitting: General Surgery

## 2017-05-25 LAB — MAGNESIUM: MAGNESIUM: 2.2 mg/dL (ref 1.7–2.4)

## 2017-05-25 LAB — BASIC METABOLIC PANEL
Anion gap: 10 (ref 5–15)
BUN: 10 mg/dL (ref 6–20)
CO2: 23 mmol/L (ref 22–32)
CREATININE: 0.95 mg/dL (ref 0.61–1.24)
Calcium: 7.8 mg/dL — ABNORMAL LOW (ref 8.9–10.3)
Chloride: 105 mmol/L (ref 101–111)
GFR calc Af Amer: >60 mL/min (ref >60)
Glucose, Bld: 113 mg/dL — ABNORMAL HIGH (ref 65–99)
POTASSIUM: 3.7 mmol/L (ref 3.5–5.1)
SODIUM: 138 mmol/L (ref 135–145)

## 2017-05-25 LAB — CBC
HEMATOCRIT: 32.6 % — AB (ref 39.0–52.0)
Hemoglobin: 10.6 g/dL — ABNORMAL LOW (ref 13.0–17.0)
MCH: 26.3 pg (ref 26.0–34.0)
MCHC: 32.5 g/dL (ref 30.0–36.0)
MCV: 80.9 fL (ref 78.0–100.0)
PLATELETS: 415 10*3/uL — AB (ref 150–400)
RBC: 4.03 MIL/uL — ABNORMAL LOW (ref 4.22–5.81)
RDW: 15.5 % (ref 11.5–15.5)
WBC: 11.3 10*3/uL — AB (ref 4.0–10.5)

## 2017-05-25 LAB — POCT I-STAT 7, (LYTES, BLD GAS, ICA,H+H)
ACID-BASE EXCESS: 1 mmol/L (ref 0.0–2.0)
BICARBONATE: 26.5 mmol/L (ref 20.0–28.0)
Calcium, Ion: 1.08 mmol/L — ABNORMAL LOW (ref 1.15–1.40)
HCT: 25 % — ABNORMAL LOW (ref 39.0–52.0)
Hemoglobin: 8.5 g/dL — ABNORMAL LOW (ref 13.0–17.0)
O2 SAT: 100 %
PH ART: 7.366 (ref 7.350–7.450)
PO2 ART: 413 mmHg — AB (ref 83.0–108.0)
Potassium: 3.8 mmol/L (ref 3.5–5.1)
Sodium: 132 mmol/L — ABNORMAL LOW (ref 135–145)
TCO2: 28 mmol/L (ref 22–32)
pCO2 arterial: 46.2 mmHg (ref 32.0–48.0)

## 2017-05-25 LAB — URINE CULTURE: Culture: NO GROWTH

## 2017-05-25 LAB — PHOSPHORUS: PHOSPHORUS: 3.7 mg/dL (ref 2.5–4.6)

## 2017-05-25 NOTE — Consult Note (Signed)
[place] Nurse wound consult note Reason for Consult: Consult requested to apply Vac dressing to abd wound tomorrow. Will plan to apply at 0830 on Tuesday. Wound type: Full thickness post-op dressing.  [place] Nurse ostomy consult note Stoma type/location: New ileostomy from surgery on 8/26 Stomal assessment/size: Stoma is red and viable, above skin level Output: Mod amt brown liquid drainage in the pouch  Ostomy pouching: 2pc.  Education provided: Demonstrated pouch appearance and opening and closing of velcro.  Wife at the bedside and patient asked appropriate questions.  Supplies at the bedside for staff nurse use.  Will plan to perform pouch change tomorrow with Vac application. Enrolled patient in [place] program: No [name] MSN, [place], [place], [place], [place]

## 2017-05-25 NOTE — Op Note (Signed)
NAMEEVERETTE, MALL NO.:  [id number]  MEDICAL RECORD NO.:  [id number]  LOCATION:                                 FACILITY:  PHYSICIAN:  Leighton Ruff. Redmond Pulling, MD, FACSDATE OF BIRTH:  09-09-1951  DATE OF PROCEDURE:  05/24/2017 DATE OF DISCHARGE:                              OPERATIVE REPORT   PREOPERATIVE DIAGNOSES: 1. Bowel perforation. 2. Status post left colectomy and partial right transverse colectomy,     May 12, 2017.  POSTOPERATIVE DIAGNOSIS:  Anastomotic perforation of colorectal anastomosis.  PROCEDURES: 1. Exploratory laparotomy. 2. Creation of loop ileostomy. 3. Repair of serosal tear x4.  SURGEON:  Leighton Ruff. Redmond Pulling, MD, FACS.  ASSISTANT SURGEON:  Edsel Petrin. Dalbert Batman, M.D.  ANESTHESIA:  General.  EBL:  100 mL.  SPECIMEN:  Intraabdominal fluid.  INDICATIONS FOR PROCEDURE:  The patient underwent elective colonic resection on August 14th by Dr. Hulen Skains for two colonic masses.  He underwent a left colectomy as well as a partial proximal right transverse colectomy.  He recovered and was discharged on August 22nd. On Saturday evening, the patient's wife states that he complained of severe upper abdominal pain followed by general lower abdominal pain and became hypotensive and tachycardic and she brought him to the emergency room for evaluation.  A CT scan demonstrated pneumoperitoneum fluid as well as fluid and abscess around the rectosigmoid anastomosis.  He was tachycardic and had a leukocytosis.  Based on the concerns for anastomotic breakdown, I recommended urgent laparotomy with washout and diversion.  I explained the rationale while we could not repair the anastomosis and the importance of diverting the fecal stream.  I had an extensive prolonged conversation with the family preoperatively regarding the risks and benefits of surgery including, but not limited to, bleeding, infection, injury to surrounding structures such as serosal tears given  the timing of the surgery due to the dense inflammatory nature of the intestinal tract that we would likely encounter, fistula formation, blood clot formation, wound dehiscence, incisional hernia, ostomy issue such as hernia, prolapse, ischemia, prolonged hospitalization, ventilator-dependent respiratory failure, cardiac and pulmonary events, along with renal events as well as fistula formation.  They elected to proceed to the operating room.  The patient had code sepsis initiated and was started on fluid resuscitation as well as broad-spectrum antibiotics.  DESCRIPTION OF PROCEDURE:  He was taken urgently to the operating room 1 at [facility], placed supine on the operating table.  General endotracheal anesthesia was established.  Sequential compression devices were placed.  A Foley catheter was placed.  Anesthesia placed an A-line. The surgical staples were removed and his abdomen was prepped in the usual standard surgical fashion with Betadine.  A surgical time-out was performed prior to that.  The incision was gently pried apart, but also had used scalpel as well.  Subcutaneous tissue was divided with electrocautery.  The PDS sutures were encountered and cut, and removed and I entered the abdominal cavity.  There was already dense inflammation with thickening of the majority of the small bowel.  There was free fluid in the abdomen.  It was somewhat little bit murky, but there were no gross enteric  contents within the abdominal cavity.  There was some yellowish fluid on the right side as well.  At this point, Dr. Dalbert Batman joined me in the operating room.  We evacuated the abdomen, the fluid.  The omentum was densely adherent to the left side of the intestinal tract, the small bowel on the left side of the abdomen and unable to be lifted off it.  We were able to get into the pelvis.  It was densely inflamed, but could not identify the anastomosis at all in the pelvis.  He had a  plug of omentum stuck in a right indirect inguinal hernia, which was reduced.  Some of the omentum was stuck to the falciform, which was taken down.  As unfortunately expected, there were numerous dense interloop small bowel dense adhesions.  We identified the proximal transverse colon anastomosis.  I did not really mobilize that much of it as it was densely stuck and fixed, but I did not see any gross contamination in that location.  The majority of the dense inflammation or the inflamed lining of the perineum was in the pelvis suggesting that the leak was at the pelvic anastomosis.  We identified the cecum and the terminal ileum with plans at this point to bring out the distal ileum as a loop ileostomy for fecal diversion. Unfortunately, there were numerous interloop adhesions between the ileum as well as in the mesentery, was stuck to mesentery.  We ended up lysing adhesions for over an hour, some of this was finger fracture, some of it was done with Metzenbaum scissors.  There were at least 4 serosal tears made, which was an expected occurrence given the timing of this surgery and the dense interloop adhesions that were encountered.  A defect was made in one section of the mid-ileum mesentery; however, there was no sign of vascular compromise to that section of the small bowel.  Once we had mobilized enough of the distal small bowel in order to bring out a loop, we started repairing the serosal tears.  This was done with multiple 3-0 silk sutures in a Lembert fashion.  I then reapproximated the mesentery defect in the more proximal mesentery with two figure-of-eight 2-0 silk sutures.  The bowel was still viable in this location.  There was no evidence of ischemia.  At this point, we irrigated the abdomen with several liters of saline.  We felt that he would be best served by bring out the loop ileostomy in the right mid abdomen at the level of the umbilicus and not below.  A circular  defect was made in the skin with electrocautery.  Subcutaneous tissue was divided with electrocautery.  Cruciate incision was made in the anterior fascia, the muscles were spread and the posterior fascia was incised. Initially, two fingers were easily passed.  At this point, we identified a section of the distal ileum.  A small rent was made in the mesentery just next to the bowel and a red rubber catheter was passed.  We brought this out through the fascial defect for the ostomy site; however, it was still a bit too tight, so we ended up incising some of the muscle little bit further.  At this point, the loop ileostomy came out through the abdominal wall with much less tension.  At this point, a drain was placed in the pelvis and brought out through the left lower abdominal wall and secured to the skin with a nylon suture.  We then closed the abdomen  with a running #1 looped PDS x2, one from above, one from below. Given the indurated fascia and its less than ideal quality, I also placed two interrupted internal #1 Novafil sutures in the fascia serving as internal interrupted retention sutures.  The skin was left open.  At this point, I matured the ileostomy in a typical [place] fashion.  It was opened up with electrocautery, I then placed about seven interrupted 3-0 Vicryl sutures along the edges of the ostomy to break it.  It was viable.  Both the proximal and distal limbs were patent.  It should be noted that the superior opening of the loop ileostomy is actually downstream toward the cecum and the inferior portion of the loop ileostomy is actually proximal bowel.  Prior to closing the midline fascia, we did ensure that the loop ileostomy was not twisted.  All needle, instrument and sponge counts were correct x2.  There were no immediate complications.  The patient tolerated the procedure well.  He was taken to the recovery room in stable condition.     Leighton Ruff. Redmond Pulling, MD,  FACS     EMW/MEDQ  D:  05/24/2017  T:  05/25/2017  Job:  [id number]

## 2017-05-25 NOTE — Progress Notes (Signed)
GS Progress Note Subjective: Patient doing well lthis AM.  Tachycardic.  Minimal distress.  Ileostomy functioning very well.  Objective: Vital signs in last 24 hours: Temp:  [97.9 F (36.6 C)-99.9 F (37.7 C)] 99.7 F (37.6 C) (08/27 0351) Pulse Rate:  [106-134] 121 (08/27 0700) Resp:  [11-21] 20 (08/27 0700) BP: (123-160)/(75-121) 132/91 (08/27 0700) SpO2:  [92 %-98 %] 95 % (08/27 0700) Arterial Line BP: (117-170)/(65-85) 133/71 (08/27 0700) Weight:  [80.4 kg (177 lb 4 oz)] 80.4 kg (177 lb 4 oz) (08/27 0454)    Intake/Output from previous day: 08/26 0701 - 08/27 0700 In: 3465 [I.V.:2900; NG/GT:15; IV Piggyback:250] Out: 2200 [Urine:1275; Emesis/NG output:600; Drains:225; Blood:100] Intake/Output this shift: No intake/output data recorded.  Lungs: Clear to auscultation  Abd: Soft, good bowel sounds.  225 cc serosanguinous drainage from [place].  Ileostomy has been putting out gas and enteric fluid.  Extremities: No clinical signs or symptoms of DVT  Neuro: Intact  Lab Results: CBC   Recent Labs  05/24/17 0945 05/25/17 0535  WBC 13.9* 11.3*  HGB 10.8* 10.6*  HCT 32.8* 32.6*  PLT 433* 415*   BMET  Recent Labs  05/24/17 0945 05/25/17 0535  NA 131* 138  K 3.8 3.7  CL 101 105  CO2 22 23  GLUCOSE 114* 113*  BUN 12 10  CREATININE 0.86 0.95  CALCIUM 7.6* 7.8*   PT/INR No results for input(s): LABPROT, INR in the last 72 hours. ABG  Recent Labs  05/24/17 0646  PHART 7.366  HCO3 26.5    Studies/Results: Dg Chest 2 View  Result Date: 05/24/2017 CLINICAL DATA:  Fever, abdominal pain, recent discharge from hospital on 05/20/2017 for left hemicolectomy performed 05/12/2017. EXAM: CHEST  2 VIEW COMPARISON:  None. FINDINGS: Crescent[place] air like lucencies are seen beneath the diaphragm consistent with free intraperitoneal air. Given the patient's history of prior left hemicolectomy on 05/12/2017, these findings should have resolved by this time. CT with IV and  oral contrast is therefore recommended for better correlation and to evaluate for possible site of leak and potential abscesses. Heart size is normal. There is mild uncoiling of the thoracic aorta without aneurysm. There is bibasilar atelectasis. IMPRESSION: 1. There appears be a small amount of free air beneath the diaphragm. The patient underwent left hemicolectomy on 05/12/2017 and findings should have resolved by now. CT is recommended with IV and oral contrast for better assessment. These results were called by telephone at the time of interpretation on 05/24/2017 at 12:08 am to Dr. Noemi Chapel , who verbally acknowledged these results. 2. Bibasilar atelectasis is noted. Electronically Signed   By: Ashley Royalty M.D.   On: 05/24/2017 00:09   Ct Abdomen Pelvis W Contrast  Result Date: 05/24/2017 CLINICAL DATA:  Abdominal pain. Right upper quadrant pain and fever. Left hemicolectomy 12 days prior. EXAM: CT ABDOMEN AND PELVIS WITH CONTRAST TECHNIQUE: Multidetector CT imaging of the abdomen and pelvis was performed using the standard protocol following bolus administration of intravenous contrast. CONTRAST:  129mL ISOVUE-300 IOPAMIDOL (ISOVUE-300) INJECTION 61% COMPARISON:  Radiograph 05/16/2017 FINDINGS: Lower chest: Lung base consolidations in the right and left lower lobe and lingula. Small left pleural effusion. Hepatobiliary: No focal liver abnormality is seen. No gallstones or biliary dilatation. Fluid adjacent to the gallbladder limits assessment for gallbladder wall thickening. Pancreas: No ductal dilatation or inflammation. Spleen: Normal in size without focal abnormality. Adrenals/Urinary Tract: Normal adrenal glands. No hydronephrosis or perinephric edema. Symmetric excretion on delayed phase imaging. Urinary bladder is  physiologically distended. Stomach/Bowel: Large volume of free air in the abdomen and pelvis consistent with bowel perforation. Extraluminal heterogeneous with 5.9 x 3.1 cm fluid  collection in the deep pelvis adjacent to the sigmoid anastomotic suture line. The stomach is distended. Proximal small bowel loops are distended and filled with fluid and enteric contrast. Enteric sutures in the sigmoid colon. Enteric sutures in the transverse colon. Descending colon is fluid-filled with mild wall thickening. No extraluminal enteric contrast. Vascular/Lymphatic: Mild aortic atherosclerosis. No aneurysm. Portal and mesenteric veins are patent without portal venous gas. No evidence of adenopathy. Reproductive: Prostate gland is enlarged spanning 6.8 cm. Other: Moderate to large volume of free air in the abdomen, pelvis and throughout the mesentery. This is unexpected 12 days post recent abdominal surgery. Moderate free fluid prominently in the pelvis. Small pericolonic abscess in the presacral region adjacent to the sigmoid enteric sutures 5.9 x 3.1 cm. There is mesenteric and omental edema in the anterior lower abdomen. Midline skin staples. Fluid and edema track into the right inguinal canal. Musculoskeletal: There are no acute or suspicious osseous abnormalities. IMPRESSION: 1. Moderate-large volume free intra-abdominal air, scattered throughout the abdomen and pelvis. Suspect site of perforation is at the a sigmoid anastomosis as there is an adjacent presacral abscess measuring 5.8 x 3.1 cm. 2. Probable reactive ileus with dilated fluid in contrast filled small bowel. Critical Value/emergent results were called by telephone at the time of interpretation on 05/24/2017 at 3:41 am to Dr. Pryor Curia , who verbally acknowledged these results. Electronically Signed   By: Jeb Levering M.D.   On: 05/24/2017 03:42   Dg Abd Portable 1v  Result Date: 05/24/2017 CLINICAL DATA:  Nasogastric tube placement.  Ileus. EXAM: PORTABLE ABDOMEN - 1 VIEW COMPARISON:  05/16/2017 FINDINGS: Nasogastric tube is seen with tip in the gastric fundus. Catheter seen in the pelvis. Decrease in dilated bowel loops since  prior study. IMPRESSION: Nasogastric tube tip in the gastric fundus. Electronically Signed   By: Earle Gell M.D.   On: 05/24/2017 10:06    Anti-infectives: Anti-infectives    Start     Dose/Rate Route Frequency Ordered Stop   05/24/17 1200  vancomycin (VANCOCIN) IVPB 1000 mg/200 mL premix     1,000 mg 200 mL/hr over 60 Minutes Intravenous Every 12 hours 05/24/17 0035     05/24/17 0600  piperacillin-tazobactam (ZOSYN) IVPB 3.375 g     3.375 g 12.5 mL/hr over 240 Minutes Intravenous Every 8 hours 05/24/17 0035     05/24/17 0045  piperacillin-tazobactam (ZOSYN) IVPB 3.375 g     3.375 g 100 mL/hr over 30 Minutes Intravenous  Once 05/24/17 0030 05/24/17 0134   05/24/17 0045  vancomycin (VANCOCIN) IVPB 1000 mg/200 mL premix  Status:  Discontinued     1,000 mg 200 mL/hr over 60 Minutes Intravenous  Once 05/24/17 0030 05/24/17 0035   05/24/17 0045  vancomycin (VANCOCIN) 2,000 mg in sodium chloride 0.9 % 500 mL IVPB     2,000 mg 250 mL/hr over 120 Minutes Intravenous  Once 05/24/17 0035 05/24/17 0335      Assessment/Plan: s/p Procedure(s): EXPLORATORY LAPAROTOMY AND LOOP ILEOSTOMY d/c foley Ice chips.  Out of bed. Keep in ICU because of persistent tachycardia.  LOS: 1 day    Kathryne Eriksson. Dahlia Bailiff, MD, FACS [phone] [facility] 05/25/2017

## 2017-05-25 NOTE — Care Management Note (Addendum)
Case Management Note  Patient Details  Name: Brigido Mera MRN: [id number] Date of Birth: 05-31-1951  Subjective/Objective:     Pt is a readmit s/p exp lap and loop ileostomy            Action/Plan:   PTA from home independent with wife.  Pt has new ileostomy and may need wound vac at discharge - depending on progression.  CM spoke with surgery - the preference is for [facility] wound vac if needed at discharge.  CM will place [facility] application on shadow chart - [facility] liaison aware of tentative referral.   Expected Discharge Date:                  Expected Discharge Plan:  [place]  In-House Referral:     Discharge planning Services  CM Consult  Post Acute Care Choice:    Choice offered to:     DME Arranged:    DME Agency:     HH Arranged:    [place] Agency:     Status of Service:     If discussed at [employer], dates discussed:    Additional Comments:  Maryclare Labrador, RN 05/25/2017, 4:27 PM

## 2017-05-26 LAB — BASIC METABOLIC PANEL
Anion gap: 9 (ref 5–15)
BUN: 8 mg/dL (ref 6–20)
CO2: 29 mmol/L (ref 22–32)
CREATININE: 0.93 mg/dL (ref 0.61–1.24)
Calcium: 8.2 mg/dL — ABNORMAL LOW (ref 8.9–10.3)
Chloride: 107 mmol/L (ref 101–111)
GFR calc Af Amer: >60 mL/min (ref >60)
Glucose, Bld: 103 mg/dL — ABNORMAL HIGH (ref 65–99)
Potassium: 4.1 mmol/L (ref 3.5–5.1)
SODIUM: 145 mmol/L (ref 135–145)

## 2017-05-26 LAB — CBC WITH DIFFERENTIAL/PLATELET
Basophils Absolute: 0 10*3/uL (ref 0.0–0.1)
Basophils Relative: 0 %
EOS ABS: 0 10*3/uL (ref 0.0–0.7)
EOS PCT: 0 %
HCT: 31.2 % — ABNORMAL LOW (ref 39.0–52.0)
Hemoglobin: 9.7 g/dL — ABNORMAL LOW (ref 13.0–17.0)
LYMPHS ABS: 0.7 10*3/uL (ref 0.7–4.0)
Lymphocytes Relative: 8 %
MCH: 25.6 pg — AB (ref 26.0–34.0)
MCHC: 31.1 g/dL (ref 30.0–36.0)
MCV: 82.3 fL (ref 78.0–100.0)
Monocytes Absolute: 0.7 10*3/uL (ref 0.1–1.0)
Monocytes Relative: 8 %
Neutro Abs: 7.1 10*3/uL (ref 1.7–7.7)
Neutrophils Relative %: 84 %
Platelets: 426 10*3/uL — ABNORMAL HIGH (ref 150–400)
RBC: 3.79 MIL/uL — AB (ref 4.22–5.81)
RDW: 15.3 % (ref 11.5–15.5)
WBC: 8.5 10*3/uL (ref 4.0–10.5)

## 2017-05-26 MED ORDER — KCL IN DEXTROSE-NACL 20-5-0.45 MEQ/L-%-% IV SOLN
INTRAVENOUS | Status: AC
  Administered 2017-05-26 – 2017-05-27 (×4): via INTRAVENOUS
  Filled 2017-05-26 (×6): qty 1000

## 2017-05-26 NOTE — Consult Note (Addendum)
[place] Nurse wound follow-up consult note Surgical team following for assessment and plan of care to abd wound. Reason for Consult: Applied Vac to midline post-op full thickness abd wound Measurement:  24X4X1cm Wound bed: beefy red Drainage (amount, consistency, odor) small amt pink drainage, no odor Periwound: intact skin surrounding Dressing procedure/placement/frequency: Applied one piece black foam to abd wound.  Pt used PCA for pain and tolerated with mod amt discomfort.  Cont suction on at 166mm.  WOC  Will plan to change dressing on Fri.  WOC Nurse ostomy follow up Stoma type/location: Ileostomy from surgery on 8/26 Stomal assessment/size:  Stoma red and viable, above skin level, 1 1/2 inches. Peristomal assessment:  Intact skin surrounding Output: Small amt brown liquid stool  Ostomy pouching: 2pc.  Education provided:  Demonstrated pouch change to patient and wife at the bedside.  They were able to open and close velcro to empty.  Supplies at the bedside for staff nurses use. Will continue teaching sessions when pt is stable and out of ICU. Enrolled patient in [place] Start Discharge program: No Douglas Girt MSN, [place], Douglas Warren, [name]

## 2017-05-26 NOTE — Evaluation (Signed)
Physical Therapy Evaluation Patient Details Name: Douglas Warren MRN: [id number] DOB: July 20, 1951 Today's Date: 05/26/2017   History of Present Illness  66 year old white male status post partial right transverse colectomy as well as left colectomy on August 14 for colonic mass with readmission due to perforation of anastomosis. Pt s/p ex lap with loop ileostomy 8/27  Clinical Impression  Pt pleasant and willing to mobilize although somewhat apprehensive and with decreased cognition and problem solving from baseline per pt and spouse. Pt with decreased activity tolerance, transfers and gait who will benefit from acute therapy to maximize mobility, gait and independence to decrease burden of care.   HR 115-148 with activity, HR able to decrease with cueing and calming sats 92-94% on RA throughout    Follow Up Recommendations No PT follow up    Equipment Recommendations  None recommended by PT    Recommendations for Other Services       Precautions / Restrictions Precautions Precautions: Other (comment) Precaution Comments: NGT, VAC      Mobility  Bed Mobility Overal bed mobility: Needs Assistance Bed Mobility: Rolling;Sidelying to Sit Rolling: Min assist Sidelying to sit: Min assist;HOB elevated       General bed mobility comments: cues for sequence with HOB 30 degrees, use of rail and assist to elevate trunk  Transfers Overall transfer level: Needs assistance   Transfers: Sit to/from Stand Sit to Stand: Min guard         General transfer comment: cues for hand placement and safety  Ambulation/Gait Ambulation/Gait assistance: Min guard Ambulation Distance (Feet): 150 Feet Assistive device: Rolling walker (2 wheeled) Gait Pattern/deviations: Step-through pattern;Decreased stride length;Trunk flexed   Gait velocity interpretation: Below normal speed for age/gender General Gait Details: slightly flexed with slow controlled speed, cues for posture  Stairs             Wheelchair Mobility    Modified Rankin (Stroke Patients Only)       Balance Overall balance assessment: Needs assistance   Sitting balance-Leahy Scale: Good       Standing balance-Leahy Scale: Good                               Pertinent Vitals/Pain Pain Score: 4  Pain Location: abdomen Pain Descriptors / Indicators: Sore;Discomfort Pain Intervention(s): Limited activity within patient's tolerance;Repositioned;PCA encouraged;Monitored during session    Home Living Family/patient expects to be discharged to:: Private residence Living Arrangements: Spouse/significant other Available Help at Discharge: Family;Available 24 hours/day Type of Home: House Home Access: Stairs to enter Entrance Stairs-Rails: Left Entrance Stairs-Number of Steps: 2 Home Layout: One level Home Equipment: Hand held shower head;Grab bars - tub/shower;Other (comment) (walking stick)      Prior Function Level of Independence: Independent         Comments: Pt works as a Lawyer        Extremity/Trunk Assessment   Upper Extremity Assessment Upper Extremity Assessment: Overall WFL for tasks assessed    Lower Extremity Assessment Lower Extremity Assessment: Overall WFL for tasks assessed    Cervical / Trunk Assessment Cervical / Trunk Assessment: Normal  Communication   Communication: No difficulties  Cognition Arousal/Alertness: Awake/alert Behavior During Therapy: Flat affect Overall Cognitive Status: Impaired/Different from baseline Area of Impairment: Memory                     Memory: Decreased short-term memory  General Comments: pt with delayed problem solving compared to baseline per pt and spouse suspect due to meds      General Comments      Exercises     Assessment/Plan    PT Assessment Patient needs continued PT services  PT Problem List Decreased mobility;Decreased strength;Decreased  activity tolerance;Decreased balance;Decreased knowledge of use of DME;Pain       PT Treatment Interventions DME instruction;Gait training;Stair training;Balance training;Functional mobility training;Patient/family education;Therapeutic exercise    PT Goals (Current goals can be found in the Care Plan section)  Acute Rehab PT Goals 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 : A Lot Difficulty sitting down on and standing up from a chair with arms (e.g., wheelchair, bedside commode, etc,.)?: A Little Help needed moving to and from a bed to chair (including a wheelchair)?: A Little Help needed walking in hospital room?: A Little Help needed climbing 3-5 steps with a railing? : A Little 6 Click Score: 17    End of Session   Activity Tolerance: Patient tolerated treatment well Patient left: in chair;with call bell/phone within reach;with family/visitor present Nurse Communication: Mobility status PT Visit Diagnosis: Difficulty in walking, not elsewhere classified (R26.2)    Time: 3354-5625 PT Time Calculation (min) (ACUTE ONLY): 28 min   Charges:   PT Evaluation $PT Eval Moderate Complexity: 1 Mod PT Treatments $Gait Training: 8-22 mins   PT G Codes:        Elwyn Reach, PT [phone]   [place] 05/26/2017, 2:50 PM

## 2017-05-26 NOTE — Progress Notes (Signed)
GS Progress Note Subjective: Patient doing okay by his report, but he does not seem to be quite as "spunky" as before.   Objective: Vital signs in last 24 hours: Temp:  [97.5 F (36.4 C)-98.9 F (37.2 C)] 98.5 F (36.9 C) (08/28 0313) Pulse Rate:  [80-141] 109 (08/28 0700) Resp:  [9-27] 15 (08/28 0700) BP: (118-150)/(80-105) 121/97 (08/28 0700) SpO2:  [93 %-98 %] 95 % (08/28 0700) Arterial Line BP: (128-139)/(72-73) 128/72 (08/27 0900) Weight:  [81.5 kg (179 lb 10.8 oz)] 81.5 kg (179 lb 10.8 oz) (08/28 0500) Last BM Date: 05/25/17 (Iliostomy)  Intake/Output from previous day: 08/27 0701 - 08/28 0700 In: 3232.5 [I.V.:2625; IV Piggyback:537.5] Out: 4160 [Urine:1805; Emesis/NG output:2050; Drains:130; Stool:175] Intake/Output this shift: No intake/output data recorded.  Lungs: Clear.  IS > 1250  Abd: Distended, hypoactive bowel sounds.  Ileostomy is fine, but output only 75 cc.  Still having gase come out of the ileostomy  Extremities: No clinical signs or symptoms of DVT  Neuro: Intact  Lab Results: CBC   Recent Labs  05/25/17 0535 05/26/17 0412  WBC 11.3* 8.5  HGB 10.6* 9.7*  HCT 32.6* 31.2*  PLT 415* 426*   BMET  Recent Labs  05/25/17 0535 05/26/17 0412  NA 138 145  K 3.7 4.1  CL 105 107  CO2 23 29  GLUCOSE 113* 103*  BUN 10 8  CREATININE 0.95 0.93  CALCIUM 7.8* 8.2*   PT/INR No results for input(s): LABPROT, INR in the last 72 hours. ABG  Recent Labs  05/24/17 0646  PHART 7.366  HCO3 26.5    Studies/Results: Dg Abd Portable 1v  Result Date: 05/24/2017 CLINICAL DATA:  Nasogastric tube placement.  Ileus. EXAM: PORTABLE ABDOMEN - 1 VIEW COMPARISON:  05/16/2017 FINDINGS: Nasogastric tube is seen with tip in the gastric fundus. Catheter seen in the pelvis. Decrease in dilated bowel loops since prior study. IMPRESSION: Nasogastric tube tip in the gastric fundus. Electronically Signed   By: Earle Gell M.D.   On: 05/24/2017 10:06     Anti-infectives: Anti-infectives    Start     Dose/Rate Route Frequency Ordered Stop   05/24/17 1200  vancomycin (VANCOCIN) IVPB 1000 mg/200 mL premix     1,000 mg 200 mL/hr over 60 Minutes Intravenous Every 12 hours 05/24/17 0035     05/24/17 0600  piperacillin-tazobactam (ZOSYN) IVPB 3.375 g     3.375 g 12.5 mL/hr over 240 Minutes Intravenous Every 8 hours 05/24/17 0035     05/24/17 0045  piperacillin-tazobactam (ZOSYN) IVPB 3.375 g     3.375 g 100 mL/hr over 30 Minutes Intravenous  Once 05/24/17 0030 05/24/17 0134   05/24/17 0045  vancomycin (VANCOCIN) IVPB 1000 mg/200 mL premix  Status:  Discontinued     1,000 mg 200 mL/hr over 60 Minutes Intravenous  Once 05/24/17 0030 05/24/17 0035   05/24/17 0045  vancomycin (VANCOCIN) 2,000 mg in sodium chloride 0.9 % 500 mL IVPB     2,000 mg 250 mL/hr over 120 Minutes Intravenous  Once 05/24/17 0035 05/24/17 0335      Assessment/Plan: s/p Procedure(s): EXPLORATORY LAPAROTOMY AND LOOP ILEOSTOMY Keep NGT for now.  Lots of output  PT Change IVF for mild hypernatremia  LOS: 2 days    Kathryne Eriksson. Dahlia Bailiff, MD, FACS [phone] [facility] 05/26/2017

## 2017-05-27 LAB — BASIC METABOLIC PANEL
ANION GAP: 8 (ref 5–15)
BUN: 6 mg/dL (ref 6–20)
CALCIUM: 7.9 mg/dL — AB (ref 8.9–10.3)
CO2: 30 mmol/L (ref 22–32)
Chloride: 105 mmol/L (ref 101–111)
Creatinine, Ser: 0.65 mg/dL (ref 0.61–1.24)
GFR calc Af Amer: >60 mL/min (ref >60)
GLUCOSE: 157 mg/dL — AB (ref 65–99)
Potassium: 3.4 mmol/L — ABNORMAL LOW (ref 3.5–5.1)
Sodium: 143 mmol/L (ref 135–145)

## 2017-05-27 LAB — GLUCOSE, CAPILLARY
GLUCOSE-CAPILLARY: 117 mg/dL — AB (ref 65–99)
Glucose-Capillary: 152 mg/dL — ABNORMAL HIGH (ref 65–99)
Glucose-Capillary: 138 mg/dL — ABNORMAL HIGH (ref 65–99)

## 2017-05-27 MED ORDER — POTASSIUM CHLORIDE 10 MEQ/50ML IV SOLN
10.0000 meq | INTRAVENOUS | Status: AC
  Administered 2017-05-27 (×4): 10 meq via INTRAVENOUS
  Filled 2017-05-27 (×5): qty 50

## 2017-05-27 MED ORDER — TRACE MINERALS CR-CU-MN-SE-ZN 10-1000-500-60 MCG/ML IV SOLN
INTRAVENOUS | Status: AC
  Administered 2017-05-27: 17:00:00 via INTRAVENOUS
  Filled 2017-05-27: qty 960

## 2017-05-27 MED ORDER — CHLORHEXIDINE GLUCONATE CLOTH 2 % EX PADS
6.0000 | MEDICATED_PAD | Freq: Every day | CUTANEOUS | Status: DC
  Administered 2017-05-27 – 2017-06-04 (×7): 6 via TOPICAL

## 2017-05-27 MED ORDER — KCL IN DEXTROSE-NACL 20-5-0.45 MEQ/L-%-% IV SOLN
INTRAVENOUS | Status: AC
  Administered 2017-05-27: 18:00:00 via INTRAVENOUS
  Filled 2017-05-27 (×3): qty 1000

## 2017-05-27 MED ORDER — FAT EMULSION 20 % IV EMUL
120.0000 mL | INTRAVENOUS | Status: AC
  Administered 2017-05-27: 120 mL via INTRAVENOUS
  Filled 2017-05-27: qty 250

## 2017-05-27 MED ORDER — SODIUM CHLORIDE 0.9% FLUSH
10.0000 mL | INTRAVENOUS | Status: DC | PRN
  Administered 2017-05-30 – 2017-06-02 (×4): 10 mL
  Filled 2017-05-27 (×4): qty 40

## 2017-05-27 MED ORDER — SODIUM CHLORIDE 0.9% FLUSH
10.0000 mL | Freq: Two times a day (BID) | INTRAVENOUS | Status: DC
  Administered 2017-05-27 – 2017-06-02 (×8): 10 mL

## 2017-05-27 MED ORDER — INSULIN ASPART 100 UNIT/ML ~~LOC~~ SOLN
0.0000 [IU] | SUBCUTANEOUS | Status: DC
  Administered 2017-05-27: 2 [IU] via SUBCUTANEOUS
  Administered 2017-05-28 – 2017-05-31 (×10): 1 [IU] via SUBCUTANEOUS

## 2017-05-27 NOTE — Progress Notes (Signed)
[place] NOTE   Pharmacy Consult for TPN Indication: prolonged ileus  Patient Measurements: Height: 5\' 9"  (175.3 cm) Weight: 184 lb 15.5 oz (83.9 kg) IBW/kg (Calculated) : 70.7 TPN AdjBW (KG): 79.8 Body mass index is 27.31 kg/m.   Assessment:  66yo male s/p L-colectomy and partial R-transverse colectomy 8/14-8/23, then readmitted 05/24/17 with fever, chills, and abdominal pain.  He is now s/p exp lap & loop ileostomy on 05/25/17.  Pt has been NPO since admission and is to start TPN.  Per discussion with pt's wife, he was unable to eat much in the 3 days he was home due to feeling poorly whenever he ate, and in fact had been on a 12 day "true water only" period of fasting prior to the initial surgery on 8/14.  She also said he has a very healthy diet, that he does periodically fast as described, and estimates that he has been without significant nutrition for ~1 month.     Pt is currently in ICU, but is to transfer to step down unit today.  Therefore will not hold Lipids x 7 days, as would if he was remaining in ICU (per ASPEN guidelines)  GI:  Hx diverticulosis.  NGT with 1600 mL out.   Endo:  No hx DM, no SSI ordered Insulin requirements in the past 24 hours:  Lytes:  K 3.4- K runs x 4 ordered by MD.  Mg & Phos wnl on 8/27.  D5-1/2NS + 20 K at 144ml/hr Renal:  Cr 0.65 Pulm:  -2L Cards:  BP high, hr wnl-tachy. Hepatobil:  LFTs wnl on 8/25 Neuro: ID:  Vanc and Zosyn for R/O sepsis (8/26 >> ).  Blood and wound cx ntd.  Best Practices:  Enoxaparin 40 TPN Access:  PICC (8/29) TPN start date: 8/29 >>  Nutritional Goals (per RD recommendation on): KCal: 2300-2500 kcal Protein: 100-110 g  Current Nutrition:  NPO  Plan:  Clinimix-E 5/15 at 29ml/hr Add MVI and trace elements to TPN 20% Lipid emulsion at 12ml/hr x 12hr Sensitive SSI q4, adding insulin to TPN as indicated TPN labs in AM and qMon/Thurs F/U RD recommendations Decrease  IVF to 40ml/hr when TPN starts tonight   Lewie Chamber., PharmD [facility]

## 2017-05-27 NOTE — Progress Notes (Signed)
GS Progress Note Subjective: I could sense that the family and the patient are getting a bit discouraged and they asked about potential problems occurring.  Currently I do not get the sense of any imminent concerns about abscess for mation or fistulization.    Objective: Vital signs in last 24 hours: Temp:  [98.1 F (36.7 C)-99.1 F (37.3 C)] 99.1 F (37.3 C) (08/29 0400) Pulse Rate:  [70-126] 95 (08/29 0700) Resp:  [11-26] 14 (08/29 0800) BP: (118-147)/(88-110) 133/97 (08/29 0700) SpO2:  [91 %-98 %] 93 % (08/29 0800) Weight:  [83.9 kg (184 lb 15.5 oz)] 83.9 kg (184 lb 15.5 oz) (08/29 0500) Last BM Date: 05/26/17  Intake/Output from previous day: 08/28 0701 - 08/29 0700 In: 3768.8 [P.O.:300; I.V.:2868.8; IV Piggyback:600] Out: 2205 [Urine:850; Emesis/NG output:1200; Drains:130; Stool:25] Intake/Output this shift: Total I/O In: -  Out: 500 [Urine:500]  Lungs: Clear to auscultation  Abd: Soft, hypoactive bowel sounds.  NPWD intact.  Extremities: No changes  Neuro: Intact.  Seems down.  Lab Results: CBC   Recent Labs  05/25/17 0535 05/26/17 0412  WBC 11.3* 8.5  HGB 10.6* 9.7*  HCT 32.6* 31.2*  PLT 415* 426*   BMET  Recent Labs  05/26/17 0412 05/27/17 0317  NA 145 143  K 4.1 3.4*  CL 107 105  CO2 29 30  GLUCOSE 103* 157*  BUN 8 6  CREATININE 0.93 0.65  CALCIUM 8.2* 7.9*   PT/INR No results for input(s): LABPROT, INR in the last 72 hours. ABG No results for input(s): PHART, HCO3 in the last 72 hours.  Invalid input(s): PCO2, PO2  Studies/Results: No results found.  Anti-infectives: Anti-infectives    Start     Dose/Rate Route Frequency Ordered Stop   05/24/17 1200  vancomycin (VANCOCIN) IVPB 1000 mg/200 mL premix     1,000 mg 200 mL/hr over 60 Minutes Intravenous Every 12 hours 05/24/17 0035     05/24/17 0600  piperacillin-tazobactam (ZOSYN) IVPB 3.375 g     3.375 g 12.5 mL/hr over 240 Minutes Intravenous Every 8 hours 05/24/17 0035     05/24/17 0045  piperacillin-tazobactam (ZOSYN) IVPB 3.375 g     3.375 g 100 mL/hr over 30 Minutes Intravenous  Once 05/24/17 0030 05/24/17 0134   05/24/17 0045  vancomycin (VANCOCIN) IVPB 1000 mg/200 mL premix  Status:  Discontinued     1,000 mg 200 mL/hr over 60 Minutes Intravenous  Once 05/24/17 0030 05/24/17 0035   05/24/17 0045  vancomycin (VANCOCIN) 2,000 mg in sodium chloride 0.9 % 500 mL IVPB     2,000 mg 250 mL/hr over 120 Minutes Intravenous  Once 05/24/17 0035 05/24/17 0335      Assessment/Plan: s/p Procedure(s): EXPLORATORY LAPAROTOMY AND LOOP ILEOSTOMY PICC and TPN to be started.  Will transfer to SDU   LOS: 3 days    Kathryne Eriksson. Dahlia Bailiff, MD, FACS [phone] [facility] 05/27/2017

## 2017-05-27 NOTE — Progress Notes (Signed)
Peripherally Inserted Central Catheter/Midline Placement  The IV Nurse has discussed with the patient and/or persons authorized to consent for the patient, the purpose of this procedure and the potential benefits and risks involved with this procedure.  The benefits include less needle sticks, lab draws from the catheter, and the patient may be discharged home with the catheter. Risks include, but not limited to, infection, bleeding, blood clot (thrombus formation), and puncture of an artery; nerve damage and irregular heartbeat and possibility to perform a PICC exchange if needed/ordered by physician.  Alternatives to this procedure were also discussed.  Bard Power PICC patient education guide, fact sheet on infection prevention and patient information card has been provided to patient /or left at bedside.    PICC/Midline Placement Documentation  PICC Double Lumen 05/27/17 PICC Right Brachial 42 cm 0 cm (Active)  Indication for Insertion or Continuance of Line Administration of hyperosmolar/irritating solutions (i.e. TPN, Vancomycin, etc.) 05/27/2017 11:00 AM  Exposed Catheter (cm) 0 cm 05/27/2017 11:00 AM  Dressing Change Due 06/03/17 05/27/2017 11:00 AM       Jule Economy Horton 05/27/2017, 11:47 AM

## 2017-05-28 LAB — GLUCOSE, CAPILLARY
GLUCOSE-CAPILLARY: 130 mg/dL — AB (ref 65–99)
GLUCOSE-CAPILLARY: 116 mg/dL — AB (ref 65–99)
GLUCOSE-CAPILLARY: 124 mg/dL — AB (ref 65–99)
GLUCOSE-CAPILLARY: 139 mg/dL — AB (ref 65–99)
Glucose-Capillary: 125 mg/dL — ABNORMAL HIGH (ref 65–99)
Glucose-Capillary: 127 mg/dL — ABNORMAL HIGH (ref 65–99)

## 2017-05-28 LAB — CBC WITH DIFFERENTIAL/PLATELET
BASOS ABS: 0 10*3/uL (ref 0.0–0.1)
Basophils Relative: 0 %
EOS ABS: 0.1 10*3/uL (ref 0.0–0.7)
EOS PCT: 1 %
HCT: 27.5 % — ABNORMAL LOW (ref 39.0–52.0)
Hemoglobin: 8.5 g/dL — ABNORMAL LOW (ref 13.0–17.0)
LYMPHS ABS: 0.8 10*3/uL (ref 0.7–4.0)
Lymphocytes Relative: 7 %
MCH: 25.3 pg — AB (ref 26.0–34.0)
MCHC: 30.9 g/dL (ref 30.0–36.0)
MCV: 81.8 fL (ref 78.0–100.0)
MONO ABS: 0.8 10*3/uL (ref 0.1–1.0)
MONOS PCT: 7 %
Neutro Abs: 9.5 10*3/uL — ABNORMAL HIGH (ref 1.7–7.7)
Neutrophils Relative %: 85 %
Platelets: 402 10*3/uL — ABNORMAL HIGH (ref 150–400)
RBC: 3.36 MIL/uL — AB (ref 4.22–5.81)
RDW: 14.5 % (ref 11.5–15.5)
WBC: 11.1 10*3/uL — ABNORMAL HIGH (ref 4.0–10.5)

## 2017-05-28 LAB — COMPREHENSIVE METABOLIC PANEL
ALT: 24 U/L (ref 17–63)
AST: 19 U/L (ref 15–41)
Albumin: 1.8 g/dL — ABNORMAL LOW (ref 3.5–5.0)
Alkaline Phosphatase: 48 U/L (ref 38–126)
Anion gap: 7 (ref 5–15)
BUN: 5 mg/dL — AB (ref 6–20)
CHLORIDE: 102 mmol/L (ref 101–111)
CO2: 32 mmol/L (ref 22–32)
CREATININE: 0.72 mg/dL (ref 0.61–1.24)
Calcium: 7.9 mg/dL — ABNORMAL LOW (ref 8.9–10.3)
GFR calc Af Amer: >60 mL/min (ref >60)
Glucose, Bld: 131 mg/dL — ABNORMAL HIGH (ref 65–99)
Potassium: 3 mmol/L — ABNORMAL LOW (ref 3.5–5.1)
SODIUM: 141 mmol/L (ref 135–145)
Total Bilirubin: 0.4 mg/dL (ref 0.3–1.2)
Total Protein: 5.4 g/dL — ABNORMAL LOW (ref 6.5–8.1)

## 2017-05-28 LAB — BASIC METABOLIC PANEL
ANION GAP: 9 (ref 5–15)
BUN: 7 mg/dL (ref 6–20)
CALCIUM: 8.1 mg/dL — AB (ref 8.9–10.3)
CO2: 29 mmol/L (ref 22–32)
Chloride: 101 mmol/L (ref 101–111)
Creatinine, Ser: 0.77 mg/dL (ref 0.61–1.24)
GLUCOSE: 129 mg/dL — AB (ref 65–99)
POTASSIUM: 3.3 mmol/L — AB (ref 3.5–5.1)
SODIUM: 139 mmol/L (ref 135–145)

## 2017-05-28 LAB — MAGNESIUM: Magnesium: 2.1 mg/dL (ref 1.7–2.4)

## 2017-05-28 LAB — PREALBUMIN: Prealbumin: 7.9 mg/dL — ABNORMAL LOW (ref 18–38)

## 2017-05-28 LAB — TRIGLYCERIDES: Triglycerides: 129 mg/dL (ref <150)

## 2017-05-28 LAB — PHOSPHORUS: Phosphorus: 4 mg/dL (ref 2.5–4.6)

## 2017-05-28 MED ORDER — TRACE MINERALS CR-CU-MN-SE-ZN 10-1000-500-60 MCG/ML IV SOLN
INTRAVENOUS | Status: AC
  Administered 2017-05-28: 18:00:00 via INTRAVENOUS
  Filled 2017-05-28: qty 1440

## 2017-05-28 MED ORDER — POTASSIUM CHLORIDE 10 MEQ/50ML IV SOLN
10.0000 meq | INTRAVENOUS | Status: AC
  Administered 2017-05-28 (×4): 10 meq via INTRAVENOUS
  Filled 2017-05-28 (×4): qty 50

## 2017-05-28 MED ORDER — KCL IN DEXTROSE-NACL 20-5-0.45 MEQ/L-%-% IV SOLN
INTRAVENOUS | Status: AC
  Administered 2017-05-28 – 2017-05-29 (×3): via INTRAVENOUS
  Filled 2017-05-28 (×2): qty 1000

## 2017-05-28 MED ORDER — POTASSIUM CHLORIDE 10 MEQ/50ML IV SOLN
10.0000 meq | INTRAVENOUS | Status: DC
  Administered 2017-05-28 (×2): 10 meq via INTRAVENOUS
  Filled 2017-05-28 (×2): qty 50

## 2017-05-28 MED ORDER — FAT EMULSION 20 % IV EMUL
240.0000 mL | INTRAVENOUS | Status: AC
  Administered 2017-05-28: 240 mL via INTRAVENOUS
  Filled 2017-05-28: qty 250

## 2017-05-28 MED ORDER — POTASSIUM CHLORIDE 10 MEQ/50ML IV SOLN
10.0000 meq | INTRAVENOUS | Status: AC
  Administered 2017-05-28 – 2017-05-29 (×6): 10 meq via INTRAVENOUS
  Filled 2017-05-28 (×6): qty 50

## 2017-05-28 NOTE — Progress Notes (Signed)
[facility] Progress Note 4 Days Post-Op  Subjective: Patient transferred up to the SDU early this AM.  No distress unless he coughs.  Objective: Vital signs in last 24 hours: Temp:  [97.6 F (36.4 C)-99 F (37.2 C)] 99 F (37.2 C) (08/30 0829) Pulse Rate:  [86-118] 107 (08/30 0829) Resp:  [12-20] 18 (08/30 0829) BP: (121-154)/(86-108) 130/86 (08/30 0829) SpO2:  [93 %-100 %] 98 % (08/30 0829) Weight:  [82.4 kg (181 lb 10.5 oz)] 82.4 kg (181 lb 10.5 oz) (08/30 0323) Last BM Date: 05/26/17  Intake/Output from previous day: 08/29 0701 - 08/30 0700 In: 2652.2 [I.V.:2352.2; IV Piggyback:300] Out: 9381 [Urine:2700; Emesis/NG output:1325; Drains:40] Intake/Output this shift: No intake/output data recorded.  General: Does not look as comfortable today  Lungs: Clear to auscultation.  Abd: Soft, has some gaseous and enteric output in the bag.  NGT output dependent of level of suction (the wife has been adjusting the amount of suction based on what she saw in the ICU)  Extremities: No changes  Neuro: Intact  Lab Results:  @LABLAST2 (wbc:2,hgb:2,hct:2,plt:2) BMET ) Recent Labs  05/27/17 0317 05/28/17 0702  NA 143 141  K 3.4* 3.0*  CL 105 102  CO2 30 32  GLUCOSE 157* 131*  BUN 6 5*  CREATININE 0.65 0.72  CALCIUM 7.9* 7.9*   PT/INR No results for input(s): LABPROT, INR in the last 72 hours. ABG No results for input(s): PHART, HCO3 in the last 72 hours.  Invalid input(s): PCO2, PO2  Studies/Results: No results found.  Anti-infectives: Anti-infectives    Start     Dose/Rate Route Frequency Ordered Stop   05/24/17 1200  vancomycin (VANCOCIN) IVPB 1000 mg/200 mL premix     1,000 mg 200 mL/hr over 60 Minutes Intravenous Every 12 hours 05/24/17 0035     05/24/17 0600  piperacillin-tazobactam (ZOSYN) IVPB 3.375 g     3.375 g 12.5 mL/hr over 240 Minutes Intravenous Every 8 hours 05/24/17 0035     05/24/17 0045  piperacillin-tazobactam (ZOSYN) IVPB 3.375 g     3.375  g 100 mL/hr over 30 Minutes Intravenous  Once 05/24/17 0030 05/24/17 0134   05/24/17 0045  vancomycin (VANCOCIN) IVPB 1000 mg/200 mL premix  Status:  Discontinued     1,000 mg 200 mL/hr over 60 Minutes Intravenous  Once 05/24/17 0030 05/24/17 0035   05/24/17 0045  vancomycin (VANCOCIN) 2,000 mg in sodium chloride 0.9 % 500 mL IVPB     2,000 mg 250 mL/hr over 120 Minutes Intravenous  Once 05/24/17 0035 05/24/17 0335      Assessment/Plan: s/p Procedure(s): EXPLORATORY LAPAROTOMY AND LOOP ILEOSTOMY Hypokalemia--K+ to be replaced.  Trial intermittent 2 hour clamping of the NGT. WBC up a bit, will watch closely.  LOS: 4 days   Kathryne Eriksson. Dahlia Bailiff, MD, FACS [phone] [facility] 05/28/2017

## 2017-05-28 NOTE — Progress Notes (Signed)
Pharmacy Antibiotic Note  Douglas Warren is a 65 y.o. male admitted on 05/23/2017 with sepsis.  Pharmacy has been consulted for Vancocin and Zosyn dosing.  Continues on day #5 of abx for anastomotic perf. Afebrile, WBC up slightly to 11.1  Plan: Continue Zosyn 3.375 gm IV q8h (4 hour infusion) Paged MD and will stop vancomycin today Monitor clinical picture, renal function, VT prn F/U C&S, abx deescalation / LOT  Consider 7-10 days of empiric Zosyn with bowel perf. Monitor WBC closely  Temp (24hrs), Avg:98.4 F (36.9 C), Min:97.6 F (36.4 C), Max:99 F (37.2 C)   Recent Labs Lab 05/23/17 2316 05/24/17 0012 05/24/17 0945 05/25/17 0535 05/26/17 0412 05/27/17 0317 05/28/17 0702  WBC 20.1*  --  13.9* 11.3* 8.5  --  11.1*  CREATININE 0.96  --  0.86 0.95 0.93 0.65 0.72  LATICACIDVEN  --  1.82  --   --   --   --   --     Estimated Creatinine Clearance: 92.1 mL/min (by C-G formula based on SCr of 0.72 mg/dL).    No Known Allergies  Zosyn 8/25 >> Vanc 8/25 >> 8/30  8/26 wound:multiple organisms present none predominant 8/26 urine:NG final  8/26 blood x 2: pending 8/26 MRSA PCR: negative  Thank you for allowing pharmacy to be a part of this patient's care.  Elenor Quinones, PharmD, BCPS Clinical Pharmacist Pager [phone] 05/28/2017 11:12 AM

## 2017-05-28 NOTE — Care Management Note (Addendum)
Case Management Note  Patient Details  Name: Douglas Warren MRN: [id number] Date of Birth: 23-Jun-1951  Subjective/Objective:    From home  With wife, she has taken a leave of absence from work to be with the patient, s/p exp lap and loop ileostomy, has NG tube to suction, colostomy, wound vac to abd, receiving 6 runs of k today, also has JP drain in place and TPN.  Per pt eval no PT f/u needed.  NCM left [facility] agency list in room for wife to look over for possible HH needs.   8/31 [place], BSN - NCM spoke with Dr. Hulen Skains, he states most likely patient will not need wound vac to go home with.  NCM spoke with wife and she state she has not had a chance to look over the [facility] agency list, she would like to speak with some of her colleges in the medical field and see who they think she should use for [facility] services.  NCM informed her that will be fine we will cont to follow and check back with her at a later time.               Action/Plan: NCM will follow for dc needs.   Expected Discharge Date:                  Expected Discharge Plan:  Home/Self Care  In-House Referral:     Discharge planning Services  CM Consult  Post Acute Care Choice:    Choice offered to:     DME Arranged:    DME Agency:     HH Arranged:    HH Agency:     Status of Service:  In process, will continue to follow  If discussed at Long Length of Stay Meetings, dates discussed:    Additional Comments:  Zenon Mayo, RN 05/28/2017, 1:00 PM

## 2017-05-28 NOTE — Progress Notes (Signed)
[place] NOTE   Pharmacy Consult for TPN Indication: prolonged ileus  Patient Measurements: Height: 5\' 9"  (175.3 cm) Weight: 181 lb 10.5 oz (82.4 kg) IBW/kg (Calculated) : 70.7 TPN AdjBW (KG): 82.4 Body mass index is 26.83 kg/m.   Assessment:  66yo male s/p L-colectomy and partial R-transverse colectomy 8/14-8/23, then readmitted 05/24/17 with fever, chills, and abdominal pain.  He is now s/p exp lap & loop ileostomy on 05/25/17.  Pt has been NPO since admission and is to start TPN.  Per discussion with pt's wife, he was unable to eat much in the 3 days he was home due to feeling poorly whenever he ate, and in fact had been on a 12 day "true, water only" period of fasting prior to the initial surgery on 8/14.  She also said he has a very healthy diet, that he does periodically fast as described, and estimates that he has been without significant nutrition for ~1 month.     Pt is currently in ICU, but is to transfer to step down unit today.  Therefore will not hold Lipids x 7 days, as would if he was remaining in ICU (per ASPEN guidelines)  GI:  Hx diverticulosis.  NGT with 1325 mL out- wife has been adjusting level of suction, to trial intermittent clamping 8/30.  Abd drain 21mL. No ostomy output recorded 8/29.   Endo:  No hx DM, no SSI ordered.  CBGs 116-152 on TPN Insulin requirements in the past 24 hours:  4 Lytes:  K 3- K runs x 5 ordered by MD.  Mg 2.1, Phos 4.   Renal:  Cr 0.65, UOP 1.4 ml/kg/hr.  D5-1/2NS + 20 K at 75ml/hr Pulm:  [place]-2L Cards:  BP improved, hr wnl-tachy. Hepatobil:  LFTs wnl on 8/25 Neuro:  Morphine PCA ID:  Vanc and Zosyn for R/O sepsis (8/26 >> ).  Blood and wound cx ntd.  WBC 11.1- inc, AFeb  Best Practices:  Enoxaparin 40, mouth care TPN Access:  PICC (8/29) TPN start date: 8/29 >>  Nutritional Goals (per RD recommendation on): KCal: 2200-2400 kcal Protein: 100-110 g  Current Nutrition:  NPO  Plan:   Increase Clinimix-E 5/15 to 58ml/hr to provide 1500 kcal and 72g protein Add MVI and trace elements to TPN 20% Lipid emulsion at 80ml/hr x 12hr Sensitive SSI q4, adding insulin to TPN as indicated Add on additional K-run for total of 6 BMet this evening BMet, Mg, Phos in AM TPN labs qMon/Thurs F/U RD recommendations Decrease IVF to 59ml/hr when TPN starts tonight   Lewie Chamber., PharmD Clinical Pharmacist [facility]

## 2017-05-28 NOTE — Progress Notes (Addendum)
Initial Nutrition Assessment  DOCUMENTATION CODES:   Not applicable  INTERVENTION:    TPN per pharmacy  NUTRITION DIAGNOSIS:   Inadequate oral intake related to altered GI function as evidenced by NPO status  GOAL:   Patient will meet greater than or equal to 90% of their needs  MONITOR:   Diet advancement, PO intake, Labs, Weight trends, Skin, I & O's, TPN prescription   REASON FOR ASSESSMENT:   Consult New TPN/TNA  ASSESSMENT:   66 yo Male s/p partial right transverse colectomy as well as left colectomy on August 14 by Dr. Hulen Skains for 2 separate colonic lesions comes back in for fever, chills, hypotension and abdominal pain.   Pt s/p procedure 8/27: EXPLORATORY LAPAROTOMY CREATION OF LOOP ILEOSTOMY  RD spoke with pt and pt's wife at bedside. Wife reports prior to surgery pt was eating very healthy. "He ate a lot of whole foods." Labs and medications reviewed. K 3.0 (L). CBG's [id number]. No significant unintentional weight loss reported. NGT in place to LIS.  Patient is receiving TPN via PICC line with Clinimix E 5/15 @ 60 ml/hr and lipids @ 10 ml/hr. Provides 1502 kcal and 72 grams protein per day. Meets 68% minimum estimated energy needs and 65% minimum estimated protein needs.  Nutrition focused physical exam completed.  No muscle or subcutaneous fat depletion noticed.  Diet Order:  Diet NPO time specified TPN (CLINIMIX-E) Adult TPN (CLINIMIX-E) Adult  Skin:  Wound (see comment) (midline post-op full thickness abd wound)  Last BM:  8/30   Intake/Output Summary (Last 24 hours) at 05/28/17 1202 Last data filed at 05/28/17 1048  Gross per 24 hour  Intake          3437.16 ml  Output             4165 ml  Net          -727.84 ml   Height:   Ht Readings from Last 1 Encounters:  05/28/17 5\' 9"  (1.753 m)   Weight:   Wt Readings from Last 1 Encounters:  05/28/17 181 lb 10.5 oz (82.4 kg)   Ideal Body Weight:  72.7 kg  BMI:  Body mass index is 26.83  kg/m.  Estimated Nutritional Needs:   Kcal:  2200-2400  Protein:  110-125 gm  Fluid:  2.2-2.4 L  EDUCATION NEEDS:   No education needs identified at this time  Arthur Holms, RD, LDN Pager #: [phone] After-Hours Pager #: [phone]

## 2017-05-28 NOTE — Progress Notes (Signed)
Physical Therapy Treatment Patient Details Name: Douglas Warren MRN: [id number] DOB: [birth year] Today's Date: 05/28/2017    History of Present Illness 66 year old white male status post partial right transverse colectomy as well as left colectomy on August 14 for colonic mass with readmission due to perforation of anastomosis. Pt s/p ex lap with loop ileostomy 8/27    PT Comments    Pt pleasant with improved memory and cognition from prior session. Pt able to double his gait distance today as well as perform HEP. Encouraged daily mobility with pt and wife agreeable. Will continue to follow to maximize independence.   HR 112-140 with gait SpO2 95% on RA   Follow Up Recommendations  No PT follow up     Equipment Recommendations  None recommended by PT    Recommendations for Other Services       Precautions / Restrictions Precautions Precautions: Other (comment) Precaution Comments: NGT, VAC, jp drain Restrictions Weight Bearing Restrictions: No    Mobility  Bed Mobility Overal bed mobility: Needs Assistance Bed Mobility: Rolling;Sidelying to Sit Rolling: Min assist Sidelying to sit: Min guard       General bed mobility comments: HOB 20 degrees with cues for sequence with pt able to elevate trunk without assist today  Transfers Overall transfer level: Needs assistance   Transfers: Sit to/from Stand Sit to Stand: Min guard         General transfer comment: cues for hand placement, assist for line management  Ambulation/Gait Ambulation/Gait assistance: Min guard Ambulation Distance (Feet): 300 Feet Assistive device: Rolling walker (2 wheeled) Gait Pattern/deviations: Step-through pattern;Decreased stride length;Trunk flexed   Gait velocity interpretation: Below normal speed for age/gender General Gait Details: cues for posture and looking up, slow controlled gait   Stairs            Wheelchair Mobility    Modified Rankin (Stroke Patients Only)        Balance                                            Cognition Arousal/Alertness: Awake/alert Behavior During Therapy: Flat affect Overall Cognitive Status: Within Functional Limits for tasks assessed                                        Exercises General Exercises - Lower Extremity Long Arc Quad: AROM;Both;Seated;15 reps Hip ABduction/ADduction: AROM;Both;Seated;15 reps Hip Flexion/Marching: AROM;Both;Seated;15 reps Toe Raises: AROM;15 reps;Both;Seated Heel Raises: Both;Seated;AROM;15 reps    General Comments        Pertinent Vitals/Pain Pain Score: 3  Pain Location: abdomen Pain Descriptors / Indicators: Sore;Discomfort Pain Intervention(s): Limited activity within patient's tolerance;Repositioned    Home Living                      Prior Function            PT Goals (current goals can now be found in the care plan section) Progress towards PT goals: Progressing toward goals    Frequency           PT Plan Current plan remains appropriate    Co-evaluation              AM-PAC PT "6 Clicks" Daily Activity  Outcome Measure  Difficulty turning over  in bed (including adjusting bedclothes, sheets and blankets)?: A Little Difficulty moving from lying on back to sitting on the side of the bed? : A Lot Difficulty sitting down on and standing up from a chair with arms (e.g., wheelchair, bedside commode, etc,.)?: A Little Help needed moving to and from a bed to chair (including a wheelchair)?: A Little Help needed walking in hospital room?: A Little Help needed climbing 3-5 steps with a railing? : A Little 6 Click Score: 17    End of Session   Activity Tolerance: Patient tolerated treatment well Patient left: in chair;with call bell/phone within reach;with family/visitor present Nurse Communication: Mobility status PT Visit Diagnosis: Difficulty in walking, not elsewhere classified (R26.2)     Time:  6962-9528 PT Time Calculation (min) (ACUTE ONLY): 35 min  Charges:  $Gait Training: 8-22 mins $Therapeutic Exercise: 8-22 mins                    G Codes:       Elwyn Reach, PT [phone]   [place] 05/28/2017, 12:16 PM

## 2017-05-29 LAB — GLUCOSE, CAPILLARY
GLUCOSE-CAPILLARY: 118 mg/dL — AB (ref 65–99)
Glucose-Capillary: 127 mg/dL — ABNORMAL HIGH (ref 65–99)
Glucose-Capillary: 121 mg/dL — ABNORMAL HIGH (ref 65–99)
Glucose-Capillary: 110 mg/dL — ABNORMAL HIGH (ref 65–99)
Glucose-Capillary: 111 mg/dL — ABNORMAL HIGH (ref 65–99)
Glucose-Capillary: 125 mg/dL — ABNORMAL HIGH (ref 65–99)

## 2017-05-29 LAB — CBC WITH DIFFERENTIAL/PLATELET
BASOS ABS: 0 10*3/uL (ref 0.0–0.1)
Basophils Relative: 0 %
Eosinophils Absolute: 0.2 10*3/uL (ref 0.0–0.7)
Eosinophils Relative: 2 %
HEMATOCRIT: 28.2 % — AB (ref 39.0–52.0)
HEMOGLOBIN: 8.7 g/dL — AB (ref 13.0–17.0)
Lymphocytes Relative: 9 %
Lymphs Abs: 0.8 10*3/uL (ref 0.7–4.0)
MCH: 25.2 pg — ABNORMAL LOW (ref 26.0–34.0)
MCHC: 30.9 g/dL (ref 30.0–36.0)
MCV: 81.7 fL (ref 78.0–100.0)
Monocytes Absolute: 0.6 10*3/uL (ref 0.1–1.0)
Monocytes Relative: 6 %
NEUTROS ABS: 7.6 10*3/uL (ref 1.7–7.7)
NEUTROS PCT: 83 %
Platelets: 417 10*3/uL — ABNORMAL HIGH (ref 150–400)
RBC: 3.45 MIL/uL — AB (ref 4.22–5.81)
RDW: 14.6 % (ref 11.5–15.5)
WBC: 9.3 10*3/uL (ref 4.0–10.5)

## 2017-05-29 LAB — BASIC METABOLIC PANEL
ANION GAP: 5 (ref 5–15)
BUN: 8 mg/dL (ref 6–20)
CO2: 31 mmol/L (ref 22–32)
Calcium: 8.1 mg/dL — ABNORMAL LOW (ref 8.9–10.3)
Chloride: 105 mmol/L (ref 101–111)
Creatinine, Ser: 0.75 mg/dL (ref 0.61–1.24)
GLUCOSE: 122 mg/dL — AB (ref 65–99)
POTASSIUM: 3.6 mmol/L (ref 3.5–5.1)
SODIUM: 141 mmol/L (ref 135–145)

## 2017-05-29 LAB — CULTURE, BLOOD (ROUTINE X 2)
CULTURE: NO GROWTH
CULTURE: NO GROWTH
SPECIAL REQUESTS: ADEQUATE
SPECIAL REQUESTS: ADEQUATE

## 2017-05-29 MED ORDER — POTASSIUM CHLORIDE 10 MEQ/50ML IV SOLN
10.0000 meq | INTRAVENOUS | Status: AC
  Administered 2017-05-29 (×4): 10 meq via INTRAVENOUS
  Filled 2017-05-29 (×4): qty 50

## 2017-05-29 MED ORDER — KCL IN DEXTROSE-NACL 20-5-0.45 MEQ/L-%-% IV SOLN
INTRAVENOUS | Status: AC
  Administered 2017-05-29 – 2017-05-30 (×2): via INTRAVENOUS
  Filled 2017-05-29 (×2): qty 1000

## 2017-05-29 MED ORDER — TRACE MINERALS CR-CU-MN-SE-ZN 10-1000-500-60 MCG/ML IV SOLN
INTRAVENOUS | Status: AC
  Administered 2017-05-29: 17:00:00 via INTRAVENOUS
  Filled 2017-05-29: qty 1920

## 2017-05-29 MED ORDER — FAT EMULSION 20 % IV EMUL
240.0000 mL | INTRAVENOUS | Status: AC
  Administered 2017-05-29: 240 mL via INTRAVENOUS
  Filled 2017-05-29: qty 250

## 2017-05-29 NOTE — Progress Notes (Signed)
Attempted report 

## 2017-05-29 NOTE — Consult Note (Addendum)
[place] Nurse wound follow-up consult note Surgical team following for assessment and plan of care to abd wound. Reason for Consult:  Vac change to midline post-op full thickness abd wound Wound bed: beefy red Drainage (amount, consistency, odor) small amt pink drainage, no odor Periwound: intact skin surrounding Dressing procedure/placement/frequency: Applied one piece black foam to abd wound.  Pt used PCA for pain and tolerated with mod amt discomfort.  Cont suction on at 157mm.  [place] team will plan to change dressing on Mon. Wound is located close to the stoma and will require a pouch change with each Vac dressing change.  [place] Nurse ostomy follow up Stoma type/location: Ileostomy from surgery on 8/26 Stomal assessment/size:  Stoma red and viable, above skin level, 1 3/4 inches. Peristomal assessment:  Intact skin surrounding Output: Large amt brown liquid stool  Ostomy pouching: 2pc.  Education provided: Patient's wife was able to apply ostomy pouch with minimal amt assistance.  Pt and wife were able to open and close velcro to empty. Reviewed pouching routines and precautions to avoid dehydration. Supplies at the bedside for staff nurses use. Educational materials left at the bedside. Will continue teaching sessions with Vac changes Q Mon/Wed/Fri. Enrolled patient in [place] Start Discharge program: Yes  [name] MSN, RN, [place], [place], [place]

## 2017-05-29 NOTE — Progress Notes (Signed)
1845 Received pt from 3MW via bed. A&O x4. NGT intact and clamped. Mid abd incision with wound vac intact. TPN infusing to PICC at [facility]. PCA Dilaudid for pain. Pt is comfortable in bed now.

## 2017-05-29 NOTE — Progress Notes (Signed)
Physical Therapy Treatment Patient Details Name: Douglas Warren MRN: [id number] DOB: 1950-11-15 Today's Date: 05/29/2017    History of Present Illness 66 year old white male status post partial right transverse colectomy as well as left colectomy on August 14 for colonic mass with readmission due to perforation of anastomosis. Pt s/p ex lap with loop ileostomy 8/27    PT Comments    Pt with continued progression of mobility and HEP. Pt pushing self to progress with gait but states he still feels reliant on RW and assist for mobility. Will continue to progress toward independence with mobility. Cues throughout for trunk and neck position with gait. HR 98-135 with gait    Follow Up Recommendations  No PT follow up     Equipment Recommendations  None recommended by PT    Recommendations for Other Services       Precautions / Restrictions Precautions Precautions: Other (comment) Precaution Comments: NGT, VAC, jp drain    Mobility  Bed Mobility               General bed mobility comments: in chair on arrival   Transfers Overall transfer level: Needs assistance   Transfers: Sit to/from Stand Sit to Stand: Supervision         General transfer comment: cues for hand placement, assist for line management  Ambulation/Gait Ambulation/Gait assistance: Min guard Ambulation Distance (Feet): 600 Feet Assistive device: Rolling walker (2 wheeled) Gait Pattern/deviations: Step-through pattern;Decreased stride length;Trunk flexed   Gait velocity interpretation: Below normal speed for age/gender General Gait Details: cues for posture and looking up, slow controlled gait   Stairs            Wheelchair Mobility    Modified Rankin (Stroke Patients Only)       Balance     Sitting balance-Leahy Scale: Good       Standing balance-Leahy Scale: Good                              Cognition Arousal/Alertness: Awake/alert Behavior During Therapy:  WFL for tasks assessed/performed Overall Cognitive Status: Within Functional Limits for tasks assessed                                        Exercises General Exercises - Lower Extremity Long Arc Quad: AROM;Both;Seated;15 reps Hip ABduction/ADduction: AROM;Both;Seated;15 reps Hip Flexion/Marching: AROM;Both;Seated;15 reps Toe Raises: AROM;15 reps;Both;Seated Heel Raises: Both;Seated;AROM;15 reps    General Comments        Pertinent Vitals/Pain Pain Score: 3  Pain Location: abdomen Pain Descriptors / Indicators: Aching;Sore Pain Intervention(s): Limited activity within patient's tolerance;Repositioned;Monitored during session    Home Living                      Prior Function            PT Goals (current goals can now be found in the care plan section) Progress towards PT goals: Progressing toward goals    Frequency    Min 3X/week      PT Plan Current plan remains appropriate    Co-evaluation              AM-PAC PT "6 Clicks" Daily Activity  Outcome Measure  Difficulty turning over in bed (including adjusting bedclothes, sheets and blankets)?: A Little Difficulty moving from lying on back to sitting  on the side of the bed? : A Lot Difficulty sitting down on and standing up from a chair with arms (e.g., wheelchair, bedside commode, etc,.)?: A Little Help needed moving to and from a bed to chair (including a wheelchair)?: A Little Help needed walking in hospital room?: A Little Help needed climbing 3-5 steps with a railing? : A Little 6 Click Score: 17    End of Session   Activity Tolerance: Patient tolerated treatment well Patient left: in chair;with call bell/phone within reach;with family/visitor present;with nursing/sitter in room Nurse Communication: Mobility status PT Visit Diagnosis: Difficulty in walking, not elsewhere classified (R26.2)     Time: 2411-4643 PT Time Calculation (min) (ACUTE ONLY): 29 min  Charges:   $Gait Training: 8-22 mins $Therapeutic Exercise: 8-22 mins                    G Codes:       Elwyn Reach, PT [phone]    [place] 05/29/2017, 1:08 PM

## 2017-05-29 NOTE — Progress Notes (Signed)
PT Cancellation Note  Patient Details Name: Douglas Warren MRN: [id number] DOB: [birth year]   Cancelled Treatment:    Reason Eval/Treat Not Completed: Patient declined, no reason specified (pt with recent VAC change and politely declined due to pain)   Chenelle Benning B Gabriele [place] 05/29/2017, 10:43 AM  Elwyn Reach, [place]

## 2017-05-29 NOTE — Progress Notes (Signed)
[facility] NOTE   Pharmacy Consult for TPN Indication: prolonged ileus  Patient Measurements: Height: 5\' 9"  (175.3 cm) Weight: 184 lb 8.4 oz (83.7 kg) IBW/kg (Calculated) : 70.7 TPN AdjBW (KG): 82.4 Body mass index is 27.25 kg/m.   Assessment:  66yo male s/p L-colectomy and partial R-transverse colectomy 8/14-8/23, then readmitted 05/24/17 with fever, chills, and abdominal pain.  He is now s/p exp lap & loop ileostomy on 05/25/17.  Pt has been NPO since admission and is to start TPN.  Per discussion with pt's wife, he was unable to eat much in the 3 days he was home due to feeling poorly whenever he ate, and in fact had been on a 12 day "true, water only" period of fasting prior to the initial surgery on 8/14.  She also said he has a very healthy diet, that he does periodically fast as described, and estimates that he has been without significant nutrition for ~1 month.     Pt is currently in ICU, but is to transfer to step down unit today.  Therefore will not hold Lipids x 7 days, as would if he was remaining in ICU (per ASPEN guidelines)  GI:  Hx diverticulosis.  NGT with 1400 mL out- wife has been adjusting level of suction, to trial intermittent clamping 8/30.  Abd drain 86mL. Ostomy output 325mL.   Endo:  No hx DM, no SSI ordered.  CBGs 118-139 on TPN Insulin requirements in the past 24 hours:  5 Lytes:  K 3.5 after 11 runs on 8/30-early am 8/31.  Mg 2.1, Phos 4 on 8/30.   Renal:  Cr 0.75, UOP 1.4 ml/kg/hr.  D5-1/2NS + 20 K at 60ml/hr. I/O 155 mL negative. Pulm:  [place]-2L Cards:  BP improved, hr wnl-tachy. Hepatobil:  LFTs wnl on 8/30. Alb 1.8, Pre-albumin 7.9.  Neuro:  Morphine PCA ID:  Zosyn for R/O sepsis (8/25 >> ), Vancomycin stopped 8/30.  Blood and wound cx ntd.  WBC stable, AFeb  Best Practices:  Enoxaparin 40, mouth care TPN Access:  PICC (8/29) TPN start date: 8/29 >>  Nutritional Goals (per RD recommendation on 8/30): KCal:  2200-2400 kcal Protein: 110-125 g  Goal TPN rate is 103 ml/hr + Lipids to reach 100% of goals  Current Nutrition:  NPO w/ sips of clear liquids  Plan:  Increase Clinimix-E 5/15 to 80ml/hr to provide 1843 kcal and 96g protein providing 83% kcal and 87% protein needs.  Add MVI and trace elements to TPN 20% Lipid emulsion at 42ml/hr x 12hr Sensitive SSI q4, adding insulin to TPN as indicated KCl 10 mEq x 4 runs today to keep K at goal  BMet, Mg, Phos in AM TPN labs qMon/Thurs F/U RD recommendations Decrease IVF to 34ml/hr when TPN starts tonight    Sloan Leiter, PharmD, BCPS Clinical Pharmacist Clinical phone 05/29/2017 until 3.30 PM - #[phone] After hours, please call #[phone] 05/29/2017, 10:27 AM

## 2017-05-29 NOTE — Progress Notes (Signed)
GS Progress Note Subjective: Patient doing fine.  Better this morning.  Sister at the bedside.  No acute distress.   Objective: Vital signs in last 24 hours: Temp:  [97.6 F (36.4 C)-98.7 F (37.1 C)] 97.9 F (36.6 C) (08/31 0752) Pulse Rate:  [97-133] 107 (08/31 0432) Resp:  [12-19] 13 (08/31 0800) BP: (123-138)/(87-107) 125/96 (08/31 0752) SpO2:  [94 %-99 %] 98 % (08/31 0800) Weight:  [83.7 kg (184 lb 8.4 oz)] 83.7 kg (184 lb 8.4 oz) (08/31 0432) Last BM Date: 05/29/17  Intake/Output from previous day: 08/30 0701 - 08/31 0700 In: 4415.2 [P.O.:150; I.V.:3265.2; IV Piggyback:1000] Out: 4570 [Urine:2750; Emesis/NG output:1400; Drains:65; Stool:355] Intake/Output this shift: Total I/O In: -  Out: 800 [Urine:600; Emesis/NG output:200]  Lungs: Clear to auscultation.  Abd: Soft, excellent bowel sounds.  Ileostomy output is good.   Extremities: No changes  Neuro: Intact  Lab Results: CBC   Recent Labs  05/28/17 0702 05/29/17 0525  WBC 11.1* 9.3  HGB 8.5* 8.7*  HCT 27.5* 28.2*  PLT 402* 417*   BMET  Recent Labs  05/28/17 2000 05/29/17 0525  NA 139 141  K 3.3* 3.6  CL 101 105  CO2 29 31  GLUCOSE 129* 122*  BUN 7 8  CREATININE 0.77 0.75  CALCIUM 8.1* 8.1*   PT/INR No results for input(s): LABPROT, INR in the last 72 hours. ABG No results for input(s): PHART, HCO3 in the last 72 hours.  Invalid input(s): PCO2, PO2  Studies/Results: No results found.  Anti-infectives: Anti-infectives    Start     Dose/Rate Route Frequency Ordered Stop   05/24/17 1200  vancomycin (VANCOCIN) IVPB 1000 mg/200 mL premix  Status:  Discontinued     1,000 mg 200 mL/hr over 60 Minutes Intravenous Every 12 hours 05/24/17 0035 05/28/17 1054   05/24/17 0600  piperacillin-tazobactam (ZOSYN) IVPB 3.375 g     3.375 g 12.5 mL/hr over 240 Minutes Intravenous Every 8 hours 05/24/17 0035     05/24/17 0045  piperacillin-tazobactam (ZOSYN) IVPB 3.375 g     3.375 g 100 mL/hr over 30  Minutes Intravenous  Once 05/24/17 0030 05/24/17 0134   05/24/17 0045  vancomycin (VANCOCIN) IVPB 1000 mg/200 mL premix  Status:  Discontinued     1,000 mg 200 mL/hr over 60 Minutes Intravenous  Once 05/24/17 0030 05/24/17 0035   05/24/17 0045  vancomycin (VANCOCIN) 2,000 mg in sodium chloride 0.9 % 500 mL IVPB     2,000 mg 250 mL/hr over 120 Minutes Intravenous  Once 05/24/17 0035 05/24/17 0335      Assessment/Plan: s/p Procedure(s): EXPLORATORY LAPAROTOMY AND LOOP ILEOSTOMY Advance diet sips of clear liquids.  Transfer to 6N.  Concern that he had a bowel movement yesterday.  This is a good thing and not a bad thing.  No ngegative consequences.    Will likely happen again, but not on a regular basis.   LOS: 5 days    Kathryne Eriksson. Dahlia Bailiff, MD, FACS [phone] [facility] 05/29/2017

## 2017-05-30 LAB — BASIC METABOLIC PANEL
Anion gap: 7 (ref 5–15)
BUN: 10 mg/dL (ref 6–20)
CALCIUM: 8 mg/dL — AB (ref 8.9–10.3)
CO2: 27 mmol/L (ref 22–32)
CREATININE: 0.76 mg/dL (ref 0.61–1.24)
Chloride: 101 mmol/L (ref 101–111)
GFR calc Af Amer: >60 mL/min (ref >60)
GLUCOSE: 119 mg/dL — AB (ref 65–99)
Potassium: 3.7 mmol/L (ref 3.5–5.1)
Sodium: 135 mmol/L (ref 135–145)

## 2017-05-30 LAB — GLUCOSE, CAPILLARY
GLUCOSE-CAPILLARY: 105 mg/dL — AB (ref 65–99)
GLUCOSE-CAPILLARY: 117 mg/dL — AB (ref 65–99)
Glucose-Capillary: 117 mg/dL — ABNORMAL HIGH (ref 65–99)
Glucose-Capillary: 116 mg/dL — ABNORMAL HIGH (ref 65–99)
Glucose-Capillary: 121 mg/dL — ABNORMAL HIGH (ref 65–99)
Glucose-Capillary: 121 mg/dL — ABNORMAL HIGH (ref 65–99)

## 2017-05-30 LAB — MAGNESIUM: Magnesium: 2.3 mg/dL (ref 1.7–2.4)

## 2017-05-30 LAB — AEROBIC/ANAEROBIC CULTURE (SURGICAL/DEEP WOUND)

## 2017-05-30 LAB — AEROBIC/ANAEROBIC CULTURE W GRAM STAIN (SURGICAL/DEEP WOUND)

## 2017-05-30 LAB — PHOSPHORUS: Phosphorus: 3.9 mg/dL (ref 2.5–4.6)

## 2017-05-30 MED ORDER — FAT EMULSION 20 % IV EMUL
240.0000 mL | INTRAVENOUS | Status: AC
  Administered 2017-05-30: 240 mL via INTRAVENOUS
  Filled 2017-05-30: qty 250

## 2017-05-30 MED ORDER — TRACE MINERALS CR-CU-MN-SE-ZN 10-1000-500-60 MCG/ML IV SOLN
INTRAVENOUS | Status: AC
  Administered 2017-05-30: 18:00:00 via INTRAVENOUS
  Filled 2017-05-30: qty 2472

## 2017-05-30 MED ORDER — MORPHINE SULFATE (PF) 4 MG/ML IV SOLN
2.0000 mg | INTRAVENOUS | Status: DC | PRN
  Administered 2017-05-30 – 2017-06-01 (×3): 4 mg via INTRAVENOUS
  Filled 2017-05-30 (×3): qty 1

## 2017-05-30 MED ORDER — KCL IN DEXTROSE-NACL 20-5-0.45 MEQ/L-%-% IV SOLN
INTRAVENOUS | Status: DC
  Administered 2017-06-01 (×2): via INTRAVENOUS
  Administered 2017-06-02: 1 mL via INTRAVENOUS
  Administered 2017-06-02: 07:00:00 via INTRAVENOUS
  Filled 2017-05-30 (×4): qty 1000

## 2017-05-30 MED ORDER — POTASSIUM CHLORIDE 10 MEQ/50ML IV SOLN
10.0000 meq | INTRAVENOUS | Status: AC
  Administered 2017-05-30 (×4): 10 meq via INTRAVENOUS
  Filled 2017-05-30 (×4): qty 50

## 2017-05-30 NOTE — Progress Notes (Signed)
Witnessed Vennie Homans, RN waste 25ml morphing from pca cartridge.

## 2017-05-30 NOTE — Progress Notes (Signed)
[place] NOTE   Pharmacy Consult for TPN Indication: prolonged ileus  Patient Measurements: Height: 5\' 9"  (175.3 cm) Weight: 185 lb 3 oz (84 kg) IBW/kg (Calculated) : 70.7 TPN AdjBW (KG): 82.4 Body mass index is 27.35 kg/m.   Assessment:  66yo male s/p L-colectomy and partial R-transverse colectomy 8/14-8/23, then readmitted 05/24/17 with fever, chills, and abdominal pain.  He is now s/p exp lap & loop ileostomy on 05/25/17.  Pt has been NPO since admission and is to start TPN.  Per discussion with pt's wife, he was unable to eat much in the 3 days he was home due to feeling poorly whenever he ate, and in fact had been on a 12 day "true, water only" period of fasting prior to the initial surgery on 8/14.  She also said he has a very healthy diet, that he does periodically fast as described, and estimates that he has been without significant nutrition for ~1 month.     Pt is currently in ICU, but is to transfer to step down unit today.  Therefore will not hold Lipids x 7 days, as would if he was remaining in ICU (per ASPEN guidelines)  GI:  Hx diverticulosis.  NGT clamped.  Abd drain 267mL. Ostomy output 511mL.   Endo:  No hx DM, no SSI ordered.  CBGs 116-125 on TPN Insulin requirements in the past 24 hours:  1 unit Lytes:  K 3.7 after 4 runs yesterday and 11 runs on 8/30-early am 8/31.  Mg 2.3, Phos 3.9.   Renal:  Cr 0.76, UOP 1.6 ml/kg/hr.  D5-1/2NS + 20 K at 34ml/hr. I/O 1L negative. Pulm:  RA Cards:  BP improved, hr wnl-tachy. Hepatobil:  LFTs wnl on 8/30. Alb 1.8, Pre-albumin 7.9.  Neuro:  Morphine PCA ID:  Zosyn for R/O sepsis (8/25 >> ), Vancomycin stopped 8/30.  Blood and wound cx ntd.  WBC stable, AFeb  Best Practices:  Enoxaparin 40, mouth care TPN Access:  PICC (8/29) TPN start date: 8/29 >>  Nutritional Goals (per RD recommendation on 8/30): KCal: 2200-2400 kcal Protein: 110-125 g  Goal TPN rate is 103 ml/hr + Lipids to reach  100% of goals  Current Nutrition:  NPO w/ sips of clear liquids  Plan:  Increase Clinimix-E 5/15 to 159ml/hr. 20% Lipid emulsion at 61ml/hr x 12hr This will  provide 2235 kcal and 124g protein providing 100% kcal and protein needs.   Add MVI and trace elements to TPN Sensitive SSI q4, adding insulin to TPN as indicated Repeat KCl 10 mEq x 4 runs today to keep K at goal  BMet, Mg, Phos in AM TPN labs qMon/Thurs Decrease IVF to 42ml/hr when TPN starts tonight  Sloan Leiter, PharmD, BCPS Clinical Pharmacist Clinical phone 05/30/2017 until 3.30 PM - #[phone] After hours, please call #[phone] 05/30/2017, 7:25 AM

## 2017-05-30 NOTE — Progress Notes (Signed)
6 Days Post-Op   Subjective/Chief Complaint: NG clamped 24 hours - no nausea, vomiting, abdominal distention Increasing ileostomy output - gas, stool Small BM per rectum Minimal drain output - serous, clear   Objective: Vital signs in last 24 hours: Temp:  [97.6 F (36.4 C)-98.5 F (36.9 C)] 98.5 F (36.9 C) (09/01 0432) Pulse Rate:  [105-114] 105 (09/01 0432) Resp:  [13-26] 16 (09/01 0605) BP: (125-145)/(77-105) 137/98 (09/01 0432) SpO2:  [94 %-99 %] 95 % (09/01 0605) Weight:  [84 kg (185 lb 3 oz)] 84 kg (185 lb 3 oz) (09/01 0432) Last BM Date: 05/29/17  Intake/Output from previous day: 08/31 0701 - 09/01 0700 In: 2839 [P.O.:120; I.V.:2419; IV Piggyback:300] Out: 7622 [Urine:3225; Emesis/NG output:200; Drains:5; Stool:500] Intake/Output this shift: No intake/output data recorded.  General appearance: alert, cooperative and no distress Resp: clear to auscultation bilaterally Cardio: regular rate and rhythm, S1, S2 normal, no murmur, click, rub or gallop GI: soft, minimal tenderness; ileostomy - pink with thin dark green output Incision - VAC with good seal  Lab Results:   Recent Labs  05/28/17 0702 05/29/17 0525  WBC 11.1* 9.3  HGB 8.5* 8.7*  HCT 27.5* 28.2*  PLT 402* 417*   BMET  Recent Labs  05/29/17 0525 05/30/17 0430  NA 141 135  K 3.6 3.7  CL 105 101  CO2 31 27  GLUCOSE 122* 119*  BUN 8 10  CREATININE 0.75 0.76  CALCIUM 8.1* 8.0*   PT/INR No results for input(s): LABPROT, INR in the last 72 hours. ABG No results for input(s): PHART, HCO3 in the last 72 hours.  Invalid input(s): PCO2, PO2  Studies/Results: No results found.  Anti-infectives: Anti-infectives    Start     Dose/Rate Route Frequency Ordered Stop   05/24/17 1200  vancomycin (VANCOCIN) IVPB 1000 mg/200 mL premix  Status:  Discontinued     1,000 mg 200 mL/hr over 60 Minutes Intravenous Every 12 hours 05/24/17 0035 05/28/17 1054   05/24/17 0600  piperacillin-tazobactam (ZOSYN)  IVPB 3.375 g     3.375 g 12.5 mL/hr over 240 Minutes Intravenous Every 8 hours 05/24/17 0035     05/24/17 0045  piperacillin-tazobactam (ZOSYN) IVPB 3.375 g     3.375 g 100 mL/hr over 30 Minutes Intravenous  Once 05/24/17 0030 05/24/17 0134   05/24/17 0045  vancomycin (VANCOCIN) IVPB 1000 mg/200 mL premix  Status:  Discontinued     1,000 mg 200 mL/hr over 60 Minutes Intravenous  Once 05/24/17 0030 05/24/17 0035   05/24/17 0045  vancomycin (VANCOCIN) 2,000 mg in sodium chloride 0.9 % 500 mL IVPB     2,000 mg 250 mL/hr over 120 Minutes Intravenous  Once 05/24/17 0035 05/24/17 0335      Assessment/Plan: S/p left hemicolectomy/ partial right transverse colectomy 05/12/17 - Dr. Hulen Skains Anastomotic leak - s/p loop ileostomy/ pelvic drain 8/26 - Dr. Redmond Pulling Ileus resolved - remove NG tube F/E/N - Continue TPN for now.  Start clears today Wound - VAC MWF ID - Continue Zosyn x 1 week.  WBC has just returned to normal Encourage ambulation  LOS: 6 days    Douglas Warren K. 05/30/2017

## 2017-05-30 NOTE — Progress Notes (Signed)
Pt PCa discontinued Morphine 15 ml wasted in sink verified by our charge nurse Hassan Rowan.

## 2017-05-31 LAB — PHOSPHORUS: PHOSPHORUS: 3.9 mg/dL (ref 2.5–4.6)

## 2017-05-31 LAB — BASIC METABOLIC PANEL
Anion gap: 8 (ref 5–15)
BUN: 13 mg/dL (ref 6–20)
CALCIUM: 8.4 mg/dL — AB (ref 8.9–10.3)
CO2: 26 mmol/L (ref 22–32)
CREATININE: 0.8 mg/dL (ref 0.61–1.24)
Chloride: 102 mmol/L (ref 101–111)
GFR calc non Af Amer: >60 mL/min (ref >60)
Glucose, Bld: 102 mg/dL — ABNORMAL HIGH (ref 65–99)
Potassium: 4 mmol/L (ref 3.5–5.1)
SODIUM: 136 mmol/L (ref 135–145)

## 2017-05-31 LAB — CBC
HCT: 29.3 % — ABNORMAL LOW (ref 39.0–52.0)
HEMOGLOBIN: 9.5 g/dL — AB (ref 13.0–17.0)
MCH: 25.5 pg — AB (ref 26.0–34.0)
MCHC: 32.4 g/dL (ref 30.0–36.0)
MCV: 78.8 fL (ref 78.0–100.0)
PLATELETS: 521 10*3/uL — AB (ref 150–400)
RBC: 3.72 MIL/uL — ABNORMAL LOW (ref 4.22–5.81)
RDW: 14.2 % (ref 11.5–15.5)
WBC: 8.6 10*3/uL (ref 4.0–10.5)

## 2017-05-31 LAB — GLUCOSE, CAPILLARY
GLUCOSE-CAPILLARY: 100 mg/dL — AB (ref 65–99)
Glucose-Capillary: 111 mg/dL — ABNORMAL HIGH (ref 65–99)
Glucose-Capillary: 111 mg/dL — ABNORMAL HIGH (ref 65–99)
Glucose-Capillary: 122 mg/dL — ABNORMAL HIGH (ref 65–99)

## 2017-05-31 LAB — MAGNESIUM: MAGNESIUM: 2.4 mg/dL (ref 1.7–2.4)

## 2017-05-31 MED ORDER — INSULIN ASPART 100 UNIT/ML ~~LOC~~ SOLN: 0.0000 [IU] | Freq: Three times a day (TID) | SUBCUTANEOUS | Status: DC

## 2017-05-31 MED ORDER — TRACE MINERALS CR-CU-MN-SE-ZN 10-1000-500-60 MCG/ML IV SOLN
INTRAVENOUS | Status: AC
  Administered 2017-05-31: 18:00:00 via INTRAVENOUS
  Filled 2017-05-31: qty 2472

## 2017-05-31 MED ORDER — LOPERAMIDE HCL 2 MG PO CAPS: 2.0000 mg | ORAL_CAPSULE | Freq: Three times a day (TID) | ORAL | Status: DC

## 2017-05-31 MED ORDER — DIPHENHYDRAMINE HCL 50 MG/ML IJ SOLN: 25.0000 mg | Freq: Four times a day (QID) | INTRAMUSCULAR | Status: DC | PRN

## 2017-05-31 MED ORDER — ONDANSETRON HCL 4 MG/2ML IJ SOLN
4.0000 mg | Freq: Four times a day (QID) | INTRAMUSCULAR | Status: DC | PRN
  Administered 2017-06-01 (×2): 4 mg via INTRAVENOUS
  Filled 2017-05-31 (×2): qty 2

## 2017-05-31 MED ORDER — LOPERAMIDE HCL 2 MG PO CAPS
2.0000 mg | ORAL_CAPSULE | Freq: Two times a day (BID) | ORAL | Status: DC
  Administered 2017-05-31 – 2017-06-04 (×8): 2 mg via ORAL
  Filled 2017-05-31 (×8): qty 1

## 2017-05-31 MED ORDER — FAT EMULSION 20 % IV EMUL
240.0000 mL | INTRAVENOUS | Status: AC
  Administered 2017-05-31: 240 mL via INTRAVENOUS
  Filled 2017-05-31: qty 250

## 2017-05-31 NOTE — Progress Notes (Signed)
[place] NOTE   Pharmacy Consult for TPN Indication: prolonged ileus  Patient Measurements: Height: 5\' 9"  (175.3 cm) Weight: 185 lb 3 oz (84 kg) IBW/kg (Calculated) : 70.7 TPN AdjBW (KG): 82.4 Body mass index is 27.35 kg/m.   Assessment:  66yo male s/p L-colectomy and partial R-transverse colectomy 8/14-8/23, then readmitted 05/24/17 with fever, chills, and abdominal pain.  He is now s/p exp lap & loop ileostomy on 05/25/17.  Pt has been NPO since admission and is to start TPN.  Per discussion with pt's wife, he was unable to eat much in the 3 days he was home due to feeling poorly whenever he ate, and in fact had been on a 12 day "true, water only" period of fasting prior to the initial surgery on 8/14.  She also said he has a very healthy diet, that he does periodically fast as described, and estimates that he has been without significant nutrition for ~1 month.     Pt is currently in ICU, but is to transfer to step down unit today.  Therefore will not hold Lipids x 7 days, as would if he was remaining in ICU (per ASPEN guidelines)  GI:  Hx diverticulosis.  NGT clamped.  Abd drain minimal output. Ostomy output 1050 mL -increased. Adding loperamide 9/2.    Endo:  No hx DM, no SSI ordered.  CBGs <150 on TPN Insulin requirements in the past 24 hours:  2 unit Lytes:  K 4 after 4 runs yesterday.  Mg 2.4, Phos 3.9.   Renal:  Cr 0.80, BUN 13, UOP 1.2 ml/kg/hr.  D5-1/2NS + 20 K at 43ml/hr. I/O +361ml (net +1.4L for admission). Pulm:  RA Cards:  BP improved, hr wnl-tachy. Hepatobil:  LFTs wnl on 8/30. Alb 1.8, Pre-albumin 7.9.  Neuro:  Morphine prn ID:  Zosyn for R/O sepsis (8/25 >> ), Vancomycin stopped 8/30.  Blood and wound cx ntd.  WBC stable, AFeb.   Best Practices:  Enoxaparin 40, mouth care TPN Access:  PICC (8/29) TPN start date: 8/29 >>  Nutritional Goals (per RD recommendation on 8/30): KCal: 2200-2400 kcal Protein: 110-125 g  Goal  TPN rate is 103 ml/hr + Lipids to reach 100% of goals  Current Nutrition:  TPN Full liquids (plan to wean off TPN when tolerating soft diet) - 95% intake this AM  Plan:  Continue Clinimix-E 5/15 to 174ml/hr. 20% Lipid emulsion at 68ml/hr x 12hr This will  provide 2235 kcal and 124g protein providing 100% kcal and protein needs.   Add MVI and trace elements to TPN Reduce Sensitive SSI to every 8 hours TPN labs qMon/Thurs Continue IVF to 62ml/hr Follow-up ability to start weaning TPN with advancement in diet  Sloan Leiter, PharmD, BCPS Clinical Pharmacist Clinical phone 05/31/2017 until 3.30 PM - [phone] After hours, please call #[phone] 05/31/2017, 9:07 AM

## 2017-05-31 NOTE — Progress Notes (Signed)
[facility] Progress Note  7 Days Post-Op  Subjective: CC: high output from ostomy Patient with increased output of liquid stool from ileostomy overnight. Having to empty every 2-3 hrs. Denies abdominal pain, nausea. Tolerated clears.  Had sweats and chills overnight, no fever, WBC normal.   Objective: Vital signs in last 24 hours: Temp:  [98 F (36.7 C)-98.7 F (37.1 C)] 98 F (36.7 C) (09/02 0442) Pulse Rate:  [82-101] 96 (09/02 0442) Resp:  [14-19] 19 (09/02 0442) BP: (124-164)/(66-89) 124/80 (09/02 0442) SpO2:  [96 %-98 %] 97 % (09/02 0442) Weight:  [84 kg (185 lb 3 oz)] 84 kg (185 lb 3 oz) (09/02 0500) Last BM Date: 05/30/17  Intake/Output from previous day: 09/01 0701 - 09/02 0700 In: 3715.6 [P.O.:350; I.V.:2965.6; IV Piggyback:400] Out: 3400 [Urine:2325; Drains:25; ZYSAY:3016] Intake/Output this shift: No intake/output data recorded.  PE: Gen:  Alert, NAD, pleasant Card:  Regular rate and rhythm, pedal pulses 2+ BL Pulm:  Normal effort, clear to auscultation bilaterally Abd: Soft, non-tender, non-distended, bowel sounds present, no HSM; midline wound with VAC present; stoma pink, liquid stool output and gas in ostomy bag; LLQ drain with small amount serosanguinous drainage Skin: warm and dry, no rashes  Psych: A&Ox3   Lab Results:   Recent Labs  05/29/17 0525 05/31/17 0419  WBC 9.3 8.6  HGB 8.7* 9.5*  HCT 28.2* 29.3*  PLT 417* 521*   BMET  Recent Labs  05/30/17 0430 05/31/17 0419  NA 135 136  K 3.7 4.0  CL 101 102  CO2 27 26  GLUCOSE 119* 102*  BUN 10 13  CREATININE 0.76 0.80  CALCIUM 8.0* 8.4*   PT/INR No results for input(s): LABPROT, INR in the last 72 hours. CMP     Component Value Date/Time   NA 136 05/31/2017 0419   K 4.0 05/31/2017 0419   CL 102 05/31/2017 0419   CO2 26 05/31/2017 0419   GLUCOSE 102 (H) 05/31/2017 0419   BUN 13 05/31/2017 0419   CREATININE 0.80 05/31/2017 0419   CALCIUM 8.4 (L) 05/31/2017 0419   PROT 5.4 (L) 05/28/2017 0702   ALBUMIN 1.8 (L) 05/28/2017 0702   AST 19 05/28/2017 0702   ALT 24 05/28/2017 0702   ALKPHOS 48 05/28/2017 0702   BILITOT 0.4 05/28/2017 0702   GFRNONAA >60 05/31/2017 0419   GFRAA >60 05/31/2017 0419    Anti-infectives: Anti-infectives    Start     Dose/Rate Route Frequency Ordered Stop   05/24/17 1200  vancomycin (VANCOCIN) IVPB 1000 mg/200 mL premix  Status:  Discontinued     1,000 mg 200 mL/hr over 60 Minutes Intravenous Every 12 hours 05/24/17 0035 05/28/17 1054   05/24/17 0600  piperacillin-tazobactam (ZOSYN) IVPB 3.375 g     3.375 g 12.5 mL/hr over 240 Minutes Intravenous Every 8 hours 05/24/17 0035     05/24/17 0045  piperacillin-tazobactam (ZOSYN) IVPB 3.375 g     3.375 g 100 mL/hr over 30 Minutes Intravenous  Once 05/24/17 0030 05/24/17 0134   05/24/17 0045  vancomycin (VANCOCIN) IVPB 1000 mg/200 mL premix  Status:  Discontinued     1,000 mg 200 mL/hr over 60 Minutes Intravenous  Once 05/24/17 0030 05/24/17 0035   05/24/17 0045  vancomycin (VANCOCIN) 2,000 mg in sodium chloride 0.9 % 500 mL IVPB     2,000 mg 250 mL/hr over 120 Minutes Intravenous  Once 05/24/17 0035 05/24/17 0335       Assessment/Plan S/P left hemicolectomy/partial right transverse colectomy 8/14 Dr.  Wyatt Anastomotic leak - s/p loop ileostomy/pelvic drain 8/26 Dr. Redmond Pulling - Continue VAC M/W/F - JP with minimal serosanguinous output - discontinue prior to discharge Ileus resolved - NGT removed yesterday - tolerating clears - advance to fulls - 1L output from ileostomy in 24 hrs - will schedule low dose loperamide - encourage ambulation  FEN - full liquids, can wean off TPN when tolerating soft diet;  VTE - SCDs, lovenox ID - Zosyn (8/26>>), WBC 8.6  Plan: Watch output, will schedule some loperamide. Advance diet. Continue Zosyn x1 week.    LOS: 7 days    Brigid Re , [facility] Surgery 05/31/2017, 8:11 AM Pager: [phone] Consults:  [phone] Mon-Fri 7:00 am-4:30 pm Sat-Sun 7:00 am-11:30 am

## 2017-05-31 NOTE — Progress Notes (Signed)
Pt doing well, ambulating, using IS, taking chicken noodle soup.  Discussed with wife/pt plan of care and expectations of surgical recovery.

## 2017-06-01 LAB — COMPREHENSIVE METABOLIC PANEL
ALK PHOS: 68 U/L (ref 38–126)
ALT: 65 U/L — AB (ref 17–63)
AST: 36 U/L (ref 15–41)
Albumin: 2.1 g/dL — ABNORMAL LOW (ref 3.5–5.0)
Anion gap: 8 (ref 5–15)
BILIRUBIN TOTAL: 0.2 mg/dL — AB (ref 0.3–1.2)
BUN: 14 mg/dL (ref 6–20)
CALCIUM: 8.3 mg/dL — AB (ref 8.9–10.3)
CO2: 26 mmol/L (ref 22–32)
CREATININE: 0.73 mg/dL (ref 0.61–1.24)
Chloride: 99 mmol/L — ABNORMAL LOW (ref 101–111)
GFR calc Af Amer: >60 mL/min (ref >60)
Glucose, Bld: 103 mg/dL — ABNORMAL HIGH (ref 65–99)
Potassium: 3.6 mmol/L (ref 3.5–5.1)
Sodium: 133 mmol/L — ABNORMAL LOW (ref 135–145)
TOTAL PROTEIN: 6.1 g/dL — AB (ref 6.5–8.1)

## 2017-06-01 LAB — DIFFERENTIAL
Basophils Absolute: 0 10*3/uL (ref 0.0–0.1)
Basophils Relative: 0 %
Eosinophils Absolute: 0.2 10*3/uL (ref 0.0–0.7)
Eosinophils Relative: 2 %
LYMPHS PCT: 10 %
Lymphs Abs: 1 10*3/uL (ref 0.7–4.0)
MONO ABS: 0.9 10*3/uL (ref 0.1–1.0)
MONOS PCT: 9 %
NEUTROS ABS: 8.5 10*3/uL — AB (ref 1.7–7.7)
Neutrophils Relative %: 79 %

## 2017-06-01 LAB — CBC
HCT: 27.9 % — ABNORMAL LOW (ref 39.0–52.0)
HEMOGLOBIN: 8.9 g/dL — AB (ref 13.0–17.0)
MCH: 25.5 pg — ABNORMAL LOW (ref 26.0–34.0)
MCHC: 31.9 g/dL (ref 30.0–36.0)
MCV: 79.9 fL (ref 78.0–100.0)
Platelets: 546 10*3/uL — ABNORMAL HIGH (ref 150–400)
RBC: 3.49 MIL/uL — AB (ref 4.22–5.81)
RDW: 14.3 % (ref 11.5–15.5)
WBC: 10.7 10*3/uL — AB (ref 4.0–10.5)

## 2017-06-01 LAB — PREALBUMIN: Prealbumin: 21.1 mg/dL (ref 18–38)

## 2017-06-01 LAB — GLUCOSE, CAPILLARY
GLUCOSE-CAPILLARY: 117 mg/dL — AB (ref 65–99)
GLUCOSE-CAPILLARY: 131 mg/dL — AB (ref 65–99)
Glucose-Capillary: 127 mg/dL — ABNORMAL HIGH (ref 65–99)

## 2017-06-01 LAB — TRIGLYCERIDES: TRIGLYCERIDES: 101 mg/dL (ref <150)

## 2017-06-01 LAB — PHOSPHORUS: Phosphorus: 3.8 mg/dL (ref 2.5–4.6)

## 2017-06-01 LAB — MAGNESIUM: Magnesium: 2.2 mg/dL (ref 1.7–2.4)

## 2017-06-01 MED ORDER — FAT EMULSION 20 % IV EMUL
240.0000 mL | INTRAVENOUS | Status: DC
  Filled 2017-06-01: qty 250

## 2017-06-01 MED ORDER — INSULIN ASPART 100 UNIT/ML ~~LOC~~ SOLN
0.0000 [IU] | Freq: Three times a day (TID) | SUBCUTANEOUS | Status: DC
  Administered 2017-06-01: 1 [IU] via SUBCUTANEOUS

## 2017-06-01 MED ORDER — ALUM & MAG HYDROXIDE-SIMETH 200-200-20 MG/5ML PO SUSP
15.0000 mL | ORAL | Status: DC | PRN
  Administered 2017-06-01: 15 mL via ORAL
  Filled 2017-06-01: qty 30

## 2017-06-01 MED ORDER — FAT EMULSION 20 % IV EMUL
240.0000 mL | INTRAVENOUS | Status: DC
  Administered 2017-06-01: 240 mL via INTRAVENOUS
  Filled 2017-06-01: qty 250

## 2017-06-01 MED ORDER — PANTOPRAZOLE SODIUM 40 MG PO TBEC
40.0000 mg | DELAYED_RELEASE_TABLET | Freq: Every day | ORAL | Status: DC
  Administered 2017-06-01 – 2017-06-04 (×4): 40 mg via ORAL
  Filled 2017-06-01 (×6): qty 1

## 2017-06-01 MED ORDER — TRACE MINERALS CR-CU-MN-SE-ZN 10-1000-500-60 MCG/ML IV SOLN
INTRAVENOUS | Status: DC
  Filled 2017-06-01: qty 2472

## 2017-06-01 MED ORDER — POTASSIUM CHLORIDE 10 MEQ/50ML IV SOLN
10.0000 meq | INTRAVENOUS | Status: AC
  Administered 2017-06-01 (×2): 10 meq via INTRAVENOUS
  Filled 2017-06-01 (×2): qty 50

## 2017-06-01 MED ORDER — TRACE MINERALS CR-CU-MN-SE-ZN 10-1000-500-60 MCG/ML IV SOLN
INTRAVENOUS | Status: DC
  Administered 2017-06-01: 17:00:00 via INTRAVENOUS
  Filled 2017-06-01: qty 1200

## 2017-06-01 NOTE — Progress Notes (Addendum)
[place] NOTE   Pharmacy Consult for TPN Indication: prolonged ileus  Patient Measurements: Height: 5\' 9"  (175.3 cm) Weight: 185 lb 10 oz (84.2 kg) IBW/kg (Calculated) : 70.7 TPN AdjBW (KG): 82.4 Body mass index is 27.41 kg/m.   Assessment:  66yo male s/p L-colectomy and partial R-transverse colectomy 8/14-8/23, then readmitted 05/24/17 with fever, chills, and abdominal pain.  He is now s/p exp lap & loop ileostomy on 05/25/17.  Pt has been NPO since admission and is to start TPN.  Per discussion with pt's wife, he was unable to eat much in the 3 days he was home due to feeling poorly whenever he ate, and in fact had been on a 12 day "true, water only" period of fasting prior to the initial surgery on 8/14.  She also said he has a very healthy diet, that he does periodically fast as described, and estimates that he has been without significant nutrition for ~1 month.     GI:  Hx diverticulosis.  NGT clamped.  Abd drain minimal output. Ostomy output 2300 mL -increased. Adding loperamide 9/2. To begin wean of TPN this evening  Endo:  No hx DM, no SSI ordered.  CBGs <150 on TPN Insulin requirements in the past 24 hours:  0 unit Lytes:  K 3.6, Mg 2.2  Renal:  Cr 0.73 Pulm:  RA Cards:  BP improved, hr wnl-tachy. Hepatobil:  LFTs wnl on 9/3. Alb 2.1, Pre-albumin 7.9>21.1, Trigs 101 Neuro:  Morphine prn ID:  Zosyn for R/O sepsis (8/25 >> ), Vancomycin stopped 8/30.  Blood and wound cx ntd.  WBC stable, AFeb.   Best Practices:  Enoxaparin 40, mouth care TPN Access:  PICC (8/29) TPN start date: 8/29 >>  Nutritional Goals (per RD recommendation on 8/30): KCal: 2200-2400 kcal Protein: 110-125 g  Goal TPN rate is 103 ml/hr + Lipids to reach 100% of goals  Current Nutrition:  TPN Full liquids (plan to wean off TPN when tolerating soft diet) - 95% intake this AM  Plan:  Decrease Clinimix-E 5/15 to 50 ml/hr. 20% Lipid emulsion at 3ml/hr x  12hr  Add MVI and trace elements to TPN Reduce Sensitive SSI to every 8 hours TPN labs qMon/Thurs Continue IVF to 37ml/hr Follow-up stop of TPN 9/4 KCl 10 meq x 2  Levester Fresh, PharmD, BCPS, BCCCP Clinical Pharmacist Clinical phone for 06/01/2017 from 7a-3:30p: [phone] If after 3:30p, please call main pharmacy at: [phone] 06/01/2017 10:34 AM

## 2017-06-01 NOTE — Progress Notes (Signed)
[facility] Progress Note  8 Days Post-Op  Subjective: CC: Had some nausea last night Large ileostomy output recorded, although wife states that output was actually less than previous day? Output is very watery, was a bit thicker yesterday Minimal pain Drain output - serous, clear  Objective: Vital signs in last 24 hours: Temp:  [97.7 F (36.5 C)-98.4 F (36.9 C)] 98.4 F (36.9 C) (09/03 0445) Pulse Rate:  [92-99] 98 (09/03 0445) Resp:  [18-19] 19 (09/03 0445) BP: (123-137)/(82-91) 137/87 (09/03 0445) SpO2:  [98 %] 98 % (09/03 0445) Weight:  [84.2 kg (185 lb 10 oz)] 84.2 kg (185 lb 10 oz) (09/03 0500) Last BM Date: 06/01/17  Intake/Output from previous day: 09/02 0701 - 09/03 0700 In: 1872 [P.O.:1250; I.V.:472; IV Piggyback:150] Out: 5705 [Urine:3240; Drains:15; Stool:2450] Intake/Output this shift: Total I/O In: 58 [IV Piggyback:50] Out: 925 [Urine:775; Stool:150]  PE: Gen:  WDWN in NAD Lungs - CTA B CV - RRR Abd - soft, minimal tenderness; Ileostomy - pink; very thin greenish watery output VAC with good seal  Lab Results:   Recent Labs  05/31/17 0419 06/01/17 0451  WBC 8.6 10.7*  HGB 9.5* 8.9*  HCT 29.3* 27.9*  PLT 521* 546*   BMET  Recent Labs  05/31/17 0419 06/01/17 0451  NA 136 133*  K 4.0 3.6  CL 102 99*  CO2 26 26  GLUCOSE 102* 103*  BUN 13 14  CREATININE 0.80 0.73  CALCIUM 8.4* 8.3*   PT/INR No results for input(s): LABPROT, INR in the last 72 hours. CMP     Component Value Date/Time   NA 133 (L) 06/01/2017 0451   K 3.6 06/01/2017 0451   CL 99 (L) 06/01/2017 0451   CO2 26 06/01/2017 0451   GLUCOSE 103 (H) 06/01/2017 0451   BUN 14 06/01/2017 0451   CREATININE 0.73 06/01/2017 0451   CALCIUM 8.3 (L) 06/01/2017 0451   PROT 6.1 (L) 06/01/2017 0451   ALBUMIN 2.1 (L) 06/01/2017 0451   AST 36 06/01/2017 0451   ALT 65 (H) 06/01/2017 0451   ALKPHOS 68 06/01/2017 0451   BILITOT 0.2 (L) 06/01/2017 0451   GFRNONAA >60  06/01/2017 0451   GFRAA >60 06/01/2017 0451   Lipase  No results found for: LIPASE     Studies/Results: No results found.  Anti-infectives: Anti-infectives    Start     Dose/Rate Route Frequency Ordered Stop   05/24/17 1200  vancomycin (VANCOCIN) IVPB 1000 mg/200 mL premix  Status:  Discontinued     1,000 mg 200 mL/hr over 60 Minutes Intravenous Every 12 hours 05/24/17 0035 05/28/17 1054   05/24/17 0600  piperacillin-tazobactam (ZOSYN) IVPB 3.375 g     3.375 g 12.5 mL/hr over 240 Minutes Intravenous Every 8 hours 05/24/17 0035     05/24/17 0045  piperacillin-tazobactam (ZOSYN) IVPB 3.375 g     3.375 g 100 mL/hr over 30 Minutes Intravenous  Once 05/24/17 0030 05/24/17 0134   05/24/17 0045  vancomycin (VANCOCIN) IVPB 1000 mg/200 mL premix  Status:  Discontinued     1,000 mg 200 mL/hr over 60 Minutes Intravenous  Once 05/24/17 0030 05/24/17 0035   05/24/17 0045  vancomycin (VANCOCIN) 2,000 mg in sodium chloride 0.9 % 500 mL IVPB     2,000 mg 250 mL/hr over 120 Minutes Intravenous  Once 05/24/17 0035 05/24/17 0335       Assessment/Plan S/P left hemicolectomy/partial right transverse colectomy 8/14 Dr. Hulen Skains Anastomotic leak - s/p loop ileostomy/pelvic drain 8/26 Dr. Redmond Pulling -  Continue VAC M/W/F - JP with minimal serosanguinous output - discontinue prior to discharge Ileus resolved - NGT removed Saturday - tolerating fulls - 2.5 L output from ileostomy in 24 hrs - increase loperamide - encourage ambulation  FEN - full liquids, can wean off TPN when tolerating soft diet;  VTE - SCDs, lovenox ID - Zosyn (8/26>>), WBC 10.7  Plan: Watch output, increase loperamide. Advance diet. Continue Zosyn x1 week.  VAC change today  LOS: 8 days    Brigid Re , [facility] Surgery 06/01/2017, 10:47 AM Pager: [phone] Consults: [phone] Mon-Fri 7:00 am-4:30 pm Sat-Sun 7:00 am-11:30 am  Imogene Burn. Georgette Dover, MD, [facility] Surgery  General/ Trauma  Surgery  06/01/2017 11:15 AM

## 2017-06-01 NOTE — Progress Notes (Signed)
Pt with red thin ileostomy output at this time. Pt's stated he ate red jello and chicken with tomato soup for supper. Assured family at bedside. Will monitor pt.

## 2017-06-01 NOTE — Care Management Note (Signed)
Case Management Note  Patient Details  Name: Douglas Warren MRN: [id number] Date of Birth: 1951-09-23  Subjective/Objective:                    Action/Plan: [facility] VAC application completed and placed on shadow chart for MD/PA signature. Paged PA awaiting call back. Once signed will fax to [facility] 1 [phone].   Expected Discharge Date:                  Expected Discharge Plan:  [place]  In-House Referral:     Discharge planning Services  CM Consult  Post Acute Care Choice:  Home Health Choice offered to:     DME Arranged:  Vac DME Agency:  [facility]  HH Arranged:  RN [place] Agency:     Status of Service:  In process, will continue to follow  If discussed at Long Length of Stay Meetings, dates discussed:    Additional Comments:  Marilu Favre, RN 06/01/2017, 1:31 PM

## 2017-06-01 NOTE — Consult Note (Signed)
[facility] Nurse wound follow up Wound type: Midline surgical incision NPWT (VAc ) dressing change Measurement: 22.4 cm x 4.2 cm x 1 cm  Wound bed: beefy red, visible sutures in wound bed Drainage (amount, consistency, odor) Moderate serosanguinous  No odor.  Periwound:dressing was damp at 5 o'clock and skin is erythematous here.  I applied barrier ring at base to divert effluent/LLQ ileostomy Dressing procedure/placement/frequency:Cleanse wound with NS.  FIll with black foam and apply drape.  Seal immediately achieved. Change MOn/WEd/Fri  WOC Nurse ostomy follow up Stoma type/location: LLQ Ileostomy, os pointing downward.  Will add barrier ring Stomal assessment/size: 1 5/8" less edematous.  Marked the measuring guide with the new size.  Will cut off center to accommodate midline dressing easier.Wife is performing pouch change today with minimal assist from me Peristomal assessment: Add barrier ring.  Intact Treatment options for stomal/peristomal skin: barrier ring Output Liquid green stool Ostomy pouching: 2pc. 2 3/4" pouch with barrier ring cut off center  Education provided: WIfe performs pouch change understands rationale for barrier ring and cutting off center.  Left pattern in room but instructed to measure at each pouch change to ensure the proper fit. Able to roll close, empty and perform pouch change today.  Enrolled patient in [place] Start Discharge program: No WOC team will follow.   Domenic Moras RN BSN [place] Pager [phone]

## 2017-06-01 NOTE — Progress Notes (Signed)
Physical Therapy Treatment Patient Details Name: Douglas Warren MRN: [id number] DOB: 02-21-1951 Today's Date: 06/01/2017    History of Present Illness Pt is a 66 year old white male status post partial right transverse colectomy as well as left colectomy on August 14 for colonic mass with readmission due to perforation of anastomosis. Pt s/p ex lap with loop ileostomy 8/27   PT Comments    Pt progressing well with mobility; remains motivated to participate with PT. Able to amb with RW and min guard for safety, taking 1x seated rest break to complete BLE therex. Requires repeated cues with amb to maintain upright posture. Wife present throughout session and very supportive. Discussed trying amb without RW next visit to work on pt's stability with gait. Will continue to follow acutely.   Follow Up Recommendations  No PT follow up     Equipment Recommendations  None recommended by PT    Recommendations for Other Services       Precautions / Restrictions Precautions Precautions: Other (comment) Precaution Comments: VAC, jp drain, colostomy  Restrictions Weight Bearing Restrictions: No    Mobility  Bed Mobility Overal bed mobility: Needs Assistance Bed Mobility: Supine to Sit   Sidelying to sit: Min guard       General bed mobility comments: Min guard for safety with lines/leads  Transfers Overall transfer level: Needs assistance Equipment used: Rolling walker (2 wheeled) Transfers: Sit to/from Stand Sit to Stand: Supervision         General transfer comment: cues for hand placement, assist for line management  Ambulation/Gait Ambulation/Gait assistance: Min guard Ambulation Distance (Feet): 600 Feet Assistive device: Rolling walker (2 wheeled) Gait Pattern/deviations: Step-through pattern;Decreased stride length;Trunk flexed Gait velocity: Decreased   General Gait Details: Cues for technique with RW and upright posture with amb.    Stairs             Wheelchair Mobility    Modified Rankin (Stroke Patients Only)       Balance Overall balance assessment: Needs assistance Sitting-balance support: No upper extremity supported Sitting balance-Leahy Scale: Good     Standing balance support: Bilateral upper extremity supported Standing balance-Leahy Scale: Good Standing balance comment: Can stand with no UE support, but uses RW for hallway amb                            Cognition Arousal/Alertness: Awake/alert Behavior During Therapy: WFL for tasks assessed/performed Overall Cognitive Status: Within Functional Limits for tasks assessed                                        Exercises Total Joint Exercises Ankle Circles/Pumps: AROM;Both;15 reps;Seated Long Arc Quad: AROM;Both;15 reps;Seated Marching in Standing: AROM;Both;15 reps;Seated    General Comments General comments (skin integrity, edema, etc.): Wife present and guiding pt throughout session      Pertinent Vitals/Pain Pain Assessment: Faces Faces Pain Scale: Hurts a little bit Pain Location: abdomen Pain Descriptors / Indicators: Aching;Sore Pain Intervention(s): Limited activity within patient's tolerance;Patient requesting pain meds-RN notified    Home Living                      Prior Function            PT Goals (current goals can now be found in the care plan section) Acute Rehab PT Goals 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 : A Little Difficulty sitting down on and standing up from a chair with arms (e.g., wheelchair, bedside commode, etc,.)?: A Little Help needed moving to and from a bed to chair (including a wheelchair)?: A Little Help needed walking in hospital room?: A Little Help needed climbing 3-5 steps with a railing? : A Little 6 Click Score: 18    End of Session Equipment Utilized During Treatment: Gait belt Activity Tolerance: Patient tolerated treatment well Patient left: in chair;with call bell/phone within reach;with family/visitor present Nurse Communication: Mobility status PT Visit Diagnosis: Difficulty in walking, not elsewhere classified (R26.2)     Time: 4128-7867 PT Time Calculation (min) (ACUTE ONLY): 33 min  Charges:  $Gait Training: 8-22 mins $Therapeutic Exercise: 8-22 mins                    G Codes:      Mabeline Caras, PT, DPT Acute Rehab Services  Pager: [place] 06/01/2017, 4:45 PM

## 2017-06-01 NOTE — Care Management Important Message (Signed)
Important Message  Patient Details  Name: Douglas Warren MRN: [id number] Date of Birth: Apr 01, 1951   Medicare Important Message Given:  Yes    Tyreese Thain 06/01/2017, 11:25 AM

## 2017-06-01 NOTE — Progress Notes (Signed)
Pt nauseas at this time, had a mouthful of clear emesis. Zofran given. Pt complains of bloating, acidic and gassy feeling. Notified MD. New orders received.

## 2017-06-02 LAB — GLUCOSE, CAPILLARY
GLUCOSE-CAPILLARY: 112 mg/dL — AB (ref 65–99)
GLUCOSE-CAPILLARY: 115 mg/dL — AB (ref 65–99)
GLUCOSE-CAPILLARY: 98 mg/dL (ref 65–99)
GLUCOSE-CAPILLARY: 98 mg/dL (ref 65–99)
Glucose-Capillary: 80 mg/dL (ref 65–99)

## 2017-06-02 LAB — BASIC METABOLIC PANEL
ANION GAP: 6 (ref 5–15)
BUN: 11 mg/dL (ref 6–20)
CO2: 26 mmol/L (ref 22–32)
CREATININE: 0.85 mg/dL (ref 0.61–1.24)
Calcium: 8.5 mg/dL — ABNORMAL LOW (ref 8.9–10.3)
Chloride: 105 mmol/L (ref 101–111)
GFR calc non Af Amer: >60 mL/min (ref >60)
Glucose, Bld: 109 mg/dL — ABNORMAL HIGH (ref 65–99)
Potassium: 3.9 mmol/L (ref 3.5–5.1)
SODIUM: 137 mmol/L (ref 135–145)

## 2017-06-02 LAB — CBC
HCT: 27.6 % — ABNORMAL LOW (ref 39.0–52.0)
HEMOGLOBIN: 8.8 g/dL — AB (ref 13.0–17.0)
MCH: 25.5 pg — ABNORMAL LOW (ref 26.0–34.0)
MCHC: 31.9 g/dL (ref 30.0–36.0)
MCV: 80 fL (ref 78.0–100.0)
PLATELETS: 480 10*3/uL — AB (ref 150–400)
RBC: 3.45 MIL/uL — AB (ref 4.22–5.81)
RDW: 14.4 % (ref 11.5–15.5)
WBC: 10.3 10*3/uL (ref 4.0–10.5)

## 2017-06-02 MED ORDER — TRAMADOL HCL 50 MG PO TABS
50.0000 mg | ORAL_TABLET | Freq: Four times a day (QID) | ORAL | Status: DC | PRN
  Administered 2017-06-04: 50 mg via ORAL
  Filled 2017-06-02: qty 1

## 2017-06-02 MED ORDER — PIPERACILLIN-TAZOBACTAM 3.375 G IVPB
3.3750 g | Freq: Three times a day (TID) | INTRAVENOUS | Status: AC
  Administered 2017-06-02: 3.375 g via INTRAVENOUS
  Filled 2017-06-02: qty 50

## 2017-06-02 MED ORDER — TRACE MINERALS CR-CU-MN-SE-ZN 10-1000-500-60 MCG/ML IV SOLN: INTRAVENOUS | Status: DC

## 2017-06-02 MED ORDER — INSULIN ASPART 100 UNIT/ML ~~LOC~~ SOLN: 0.0000 [IU] | Freq: Three times a day (TID) | SUBCUTANEOUS | Status: DC

## 2017-06-02 MED ORDER — MORPHINE SULFATE (PF) 4 MG/ML IV SOLN: 2.0000 mg | INTRAVENOUS | Status: DC | PRN

## 2017-06-02 NOTE — Care Management Note (Signed)
Case Management Note  Patient Details  Name: Douglas Warren MRN: [id number] Date of Birth: 09-04-1951  Subjective/Objective:                    Action/Plan:  Spoke with patient and wife at bedside.  Confirmed face sheet information .   Provided list of home health agencies. Wife will research agencies and then decide on one. Patient and wife both have NCM direct phone number.   They believe plan is to go home with wet to dry dressing changes ( no VAC) and no JP, and of course ostomy.   Wife states she has been in health care for 30 years and feels comfortable with ostomy care. Also feels she is able to do wet to dry dressing changes once shown. Patient would like home health RN . Explained HHRN will continue education and wound assessments, but would not make daily visits. Patient and wife both voiced understanding.  Will continue to follow. Expected Discharge Date:                  Expected Discharge Plan:  [place]  In-House Referral:     Discharge planning Services  CM Consult  Post Acute Care Choice:  Home Health Choice offered to:  Patient, Spouse  DME Arranged:  Vac DME Agency:  [facility]  HH Arranged:  RN [place] Agency:     Status of Service:  In process, will continue to follow  If discussed at Long Length of Stay Meetings, dates discussed:    Additional Comments:  Marilu Favre, RN 06/02/2017, 10:17 AM

## 2017-06-02 NOTE — Progress Notes (Signed)
Nutrition Follow-up  DOCUMENTATION CODES:   Not applicable  INTERVENTION:  - Continue to encourage PO intakes of food items brought in from home.  NUTRITION DIAGNOSIS:   Inadequate oral intake related to altered GI function as evidenced by NPO status. -much improved since foods have been brought from home starting 9/2.  GOAL:   Patient will meet greater than or equal to 90% of their needs -met previously with TPN at goal rate, minimally met on average with half-rate TPN and improving PO intakes.  MONITOR:   PO intake, Weight trends, Labs, Skin, I & O's  ASSESSMENT:   66 yo Male s/p partial right transverse colectomy as well as left colectomy on August 14 by Dr. Hulen Skains for 2 separate colonic lesions comes back in for fever, chills, hypotension and abdominal pain.   9/4 NGT removed 9/1 and diet advanced to CLD on that date. Diet then advanced to FLD on 9/2 and to Regular today. Pt with double lumen PICC and TPN now at half rate: Clinimix E 5/15 @ 50 mL/hr with 20% ILE @ 20 mL/hr x12 hours. This regimen is providing 60 grams of protein (54% minimum estimated protein need) and 1332 kcal (60% minimum estimated kcal need). TPN to be d/c'ed this evening. Pt and wife at bedside and report that they prefer for pt to eat foods brought in from home. Wife states that since she has started bringing things on 9/2, pt has been eating better and feeling much better. Pt denies abdominal pain or pressure. He reports he had nausea 3 days ago but that this resolved after eating items from home rather than foods provided from the hospital. Per review, weight trending back down toward admission weight and is currently only +2 lbs/1 kg compared to admission weight.  Medications reviewed; sliding scale Novolog, 40 mg oral Protonix/day, 10 mEq IV KCl x2 runs yesterday,  Labs reviewed; CBGs: 112 and 115 mg/dL today, Ca: 8.5 mg/dL.  IVF: D5-1/2 NS-20 mEq KCl @ 50 mL/hr (204 kcal from dextrose).    8/30 Pt  s/p procedure 8/27: EXPLORATORY LAPAROTOMY CREATION OF LOOP ILEOSTOMY  - RD spoke with pt and pt's wife at bedside. - Wife reports prior to surgery pt was eating very healthy.  - "He ate a lot of whole foods." - No significant unintentional weight loss reported. - NGT in place to LIS. - Patient is receiving TPN via PICC line with Clinimix E 5/15 @ 60 ml/hr and lipids @ 10 ml/hr.  - Provides 1502 kcal and 72 grams protein per day.  - Meets 68% minimum estimated energy needs and 65% minimum estimated protein needs.  Nutrition focused physical exam completed.  No muscle or subcutaneous fat depletion noticed.   Diet Order:  Diet regular Room service appropriate? Yes; Fluid consistency: Thin  Skin:  Wound (see comment) (midline post-op full thickness abd wound)  Last BM:  9/4  Height:   Ht Readings from Last 1 Encounters:  05/28/17 _0  (1.753 m)    Weight:   Wt Readings from Last 1 Encounters:  06/02/17 178 lb 1.6 oz (80.8 kg)    Ideal Body Weight:  72.7 kg  BMI:  Body mass index is 26.3 kg/m.  Estimated Nutritional Needs:   Kcal:  2200-2400  Protein:  110-125 gm  Fluid:  2.2-2.4 L  EDUCATION NEEDS:   No education needs identified at this time     Jarome Matin, MS, RD, LDN, CNSC Inpatient Clinical Dietitian Pager # [phone] After hours/weekend pager #  [phone]

## 2017-06-02 NOTE — Progress Notes (Signed)
Physical Therapy Treatment Patient Details Name: Douglas Warren MRN: [id number] DOB: 1950-11-10 Today's Date: 06/02/2017    History of Present Illness Pt is a 66 year old white male s/p partial right transverse colectomy as well as left colectomy on August 14 for colonic mass with readmission due to perforation of anastomosis. Pt s/p ex lap with loop ileostomy 8/27   PT Comments    Pt progressing well with mobility. Able to amb 800+' with supervision and no AD, demonstrating improved stability with cues for technique and upright posture; 1x standing rest break to complete standing therex with BUE support. Pt demonstrates good awareness of line/leads management, and reports amb/completing therex with assist from wife during the day. Feel pt may be ready to d/c from acute PT services after at least one more session to complete stair training, higher level balance training, and answer any final questions; pt in agreement with this. Will continue to follow acutely.   Follow Up Recommendations  No PT follow up     Equipment Recommendations  None recommended by PT    Recommendations for Other Services       Precautions / Restrictions Precautions Precautions: Other (comment) Precaution Comments: VAC, jp drain, colostomy  Restrictions Weight Bearing Restrictions: No    Mobility  Bed Mobility               General bed mobility comments: Received standing  Transfers Overall transfer level: Needs assistance Equipment used: Rolling walker (2 wheeled);None Transfers: Sit to/from Stand Sit to Stand: Supervision         General transfer comment: Able to stand with RW and again with no AD; assist for line management  Ambulation/Gait Ambulation/Gait assistance: Supervision Ambulation Distance (Feet): 250 Feet (+800) Assistive device: 1 person hand held assist;None Gait Pattern/deviations: Step-through pattern;Decreased stride length;Trunk flexed Gait velocity: Decreased Gait  velocity interpretation: <1.8 ft/sec, indicative of risk for recurrent falls General Gait Details: Amb 250' with single UE support on IV poll. Able to amb an additional 800' with no AD or UE support, with 1x standing rest break to complete standing therex (with BUE support on rail). Repeated cues to maintain fully upright trunk posture with both amb and standing therex; cues to amb with arms at side instead of held at chest. Improved stability with amb.    Stairs            Wheelchair Mobility    Modified Rankin (Stroke Patients Only)       Balance Overall balance assessment: Needs assistance Sitting-balance support: No upper extremity supported Sitting balance-Leahy Scale: Good     Standing balance support: No upper extremity supported;During functional activity Standing balance-Leahy Scale: Fair                              Cognition Arousal/Alertness: Awake/alert Behavior During Therapy: WFL for tasks assessed/performed Overall Cognitive Status: Within Functional Limits for tasks assessed                                        Exercises Total Joint Exercises Hip ABduction/ADduction: AROM;Both;10 reps;Standing Marching in Standing: AROM;5 reps;Standing;Other (comment) (pt reports preference to march while seated, as standing causes too much abdominal pain) Standing Hip Extension: AROM;Both;10 reps;Supine    General Comments        Pertinent Vitals/Pain Pain Assessment: Faces Faces Pain Scale: Hurts a  little bit Pain Location: abdomen with upright posture Pain Descriptors / Indicators: Aching;Sore Pain Intervention(s): Limited activity within patient's tolerance;Monitored during session    Home Living                      Prior Function            PT Goals (current goals can now be found in the care plan section) Acute Rehab PT Goals 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 : A Little Difficulty sitting down on and standing up from a chair with arms (e.g., wheelchair, bedside commode, etc,.)?: A Little Help needed moving to and from a bed to chair (including a wheelchair)?: None Help needed walking in hospital room?: None Help needed climbing 3-5 steps with a railing? : A Little 6 Click Score: 20    End of Session Equipment Utilized During Treatment: Gait belt Activity Tolerance: Patient tolerated treatment well Patient left: in chair;with call bell/phone within reach Nurse Communication: Mobility status PT Visit Diagnosis: Difficulty in walking, not elsewhere classified (R26.2)     Time: 4650-3546 PT Time Calculation (min) (ACUTE ONLY): 29 min  Charges:  $Gait Training: 8-22 mins $Therapeutic Exercise: 8-22 mins                    G Codes:      Mabeline Caras, PT, DPT Acute Rehab Services  Pager: [place] 06/02/2017, 2:32 PM

## 2017-06-02 NOTE — Progress Notes (Signed)
GS Progress Note Subjective: Patient doing quite well.  Tolerating food from home better.  Will advance to regular diet.  Objective: Vital signs in last 24 hours: Temp:  [98 F (36.7 C)-98.5 F (36.9 C)] 98.2 F (36.8 C) (09/04 0656) Pulse Rate:  [92-97] 97 (09/04 0656) Resp:  [17-18] 17 (09/04 0656) BP: (114-131)/(71-85) 120/73 (09/04 0656) SpO2:  [98 %-100 %] 98 % (09/04 0656) Weight:  [80.8 kg (178 lb 1.6 oz)] 80.8 kg (178 lb 1.6 oz) (09/04 0656) Last BM Date: 06/01/17  Intake/Output from previous day: 09/03 0701 - 09/04 0700 In: 3973.7 [P.O.:920; I.V.:2903.7; IV Piggyback:150] Out: 8811 [Urine:6700; Stool:875] Intake/Output this shift: No intake/output data recorded.  Lungs: Clear  Abd: Great bowel sounds.  No blake drain output.    Ileosotmy output from noon yesterday only 750cc with clear gas coming out also.  Extremities: No clinical signs or symptoms of DVT  Neuro: Intact  Lab Results: CBC   Recent Labs  06/01/17 0451 06/02/17 0448  WBC 10.7* 10.3  HGB 8.9* 8.8*  HCT 27.9* 27.6*  PLT 546* 480*   BMET  Recent Labs  06/01/17 0451 06/02/17 0448  NA 133* 137  K 3.6 3.9  CL 99* 105  CO2 26 26  GLUCOSE 103* 109*  BUN 14 11  CREATININE 0.73 0.85  CALCIUM 8.3* 8.5*   PT/INR No results for input(s): LABPROT, INR in the last 72 hours. ABG No results for input(s): PHART, HCO3 in the last 72 hours.  Invalid input(s): PCO2, PO2  Studies/Results: No results found.  Anti-infectives: Anti-infectives    Start     Dose/Rate Route Frequency Ordered Stop   06/02/17 1000  piperacillin-tazobactam (ZOSYN) IVPB 3.375 g    Comments:  Please discontinue after the next dose.   3.375 g 12.5 mL/hr over 240 Minutes Intravenous Every 8 hours 06/02/17 0753 06/02/17 1759   05/24/17 1200  vancomycin (VANCOCIN) IVPB 1000 mg/200 mL premix  Status:  Discontinued     1,000 mg 200 mL/hr over 60 Minutes Intravenous Every 12 hours 05/24/17 0035 05/28/17 1054   05/24/17  0600  piperacillin-tazobactam (ZOSYN) IVPB 3.375 g  Status:  Discontinued     3.375 g 12.5 mL/hr over 240 Minutes Intravenous Every 8 hours 05/24/17 0035 06/02/17 0753   05/24/17 0045  piperacillin-tazobactam (ZOSYN) IVPB 3.375 g     3.375 g 100 mL/hr over 30 Minutes Intravenous  Once 05/24/17 0030 05/24/17 0134   05/24/17 0045  vancomycin (VANCOCIN) IVPB 1000 mg/200 mL premix  Status:  Discontinued     1,000 mg 200 mL/hr over 60 Minutes Intravenous  Once 05/24/17 0030 05/24/17 0035   05/24/17 0045  vancomycin (VANCOCIN) 2,000 mg in sodium chloride 0.9 % 500 mL IVPB     2,000 mg 250 mL/hr over 120 Minutes Intravenous  Once 05/24/17 0035 05/24/17 0335      Assessment/Plan: s/p Procedure(s): EXPLORATORY LAPAROTOMY AND LOOP ILEOSTOMY Advance diet  Tramadol for pain control. DC TPN after current bag.  LOS: 9 days    Kathryne Eriksson. Dahlia Bailiff, MD, FACS [phone] [facility] Surgery 06/02/2017

## 2017-06-03 LAB — GLUCOSE, CAPILLARY
GLUCOSE-CAPILLARY: 97 mg/dL (ref 65–99)
GLUCOSE-CAPILLARY: 97 mg/dL (ref 65–99)
GLUCOSE-CAPILLARY: 115 mg/dL — AB (ref 65–99)

## 2017-06-03 MED ORDER — ACETAMINOPHEN 325 MG PO TABS
650.0000 mg | ORAL_TABLET | Freq: Four times a day (QID) | ORAL | Status: DC | PRN
Start: 2017-06-03 — End: 2017-06-04

## 2017-06-03 MED ORDER — ADULT MULTIVITAMIN W/MINERALS CH
1.0000 | ORAL_TABLET | Freq: Every day | ORAL | Status: DC
  Administered 2017-06-03 – 2017-06-04 (×2): 1 via ORAL
  Filled 2017-06-03 (×2): qty 1

## 2017-06-03 MED ORDER — FERROUS GLUCONATE 324 (38 FE) MG PO TABS
324.0000 mg | ORAL_TABLET | Freq: Three times a day (TID) | ORAL | Status: DC
  Administered 2017-06-03 – 2017-06-04 (×3): 324 mg via ORAL
  Filled 2017-06-03 (×5): qty 1

## 2017-06-03 MED ORDER — NYSTATIN 100000 UNIT/GM EX POWD
Freq: Two times a day (BID) | CUTANEOUS | Status: DC
  Administered 2017-06-03 – 2017-06-04 (×3): via TOPICAL
  Filled 2017-06-03: qty 15

## 2017-06-03 MED ORDER — PIPERACILLIN-TAZOBACTAM 3.375 G IVPB: 3.3750 g | Freq: Three times a day (TID) | INTRAVENOUS | Status: DC

## 2017-06-03 NOTE — Care Management Note (Signed)
Case Management Note  Patient Details  Name: Thos Matsumoto MRN: [id number] Date of Birth: [birth year]  Subjective/Objective:                    Action/Plan: Wife and patient wanted [facility], referral called to [facility] , due to staffing [facility] are unable to accept referral. Wife aware . They woant [facility]. Referral given to Santiago Glad at [facility] and accepted. Wife and patient would like to speak directly to [place]. Santiago Glad aware and will visit .  Expected Discharge Date:                  Expected Discharge Plan:  [place]  In-House Referral:     Discharge planning Services  CM Consult  Post Acute Care Choice:  Home Health Choice offered to:  Patient, Spouse  DME Arranged:  Vac DME Agency:  [facility]  HH Arranged:  RN [place] Agency:  [facility]  Status of Service:  Completed, signed off  If discussed at [place] of Stay Meetings, dates discussed:    Additional Comments:  Marilu Favre, RN 06/03/2017, 10:25 AM

## 2017-06-03 NOTE — Progress Notes (Signed)
GS Progress Note Subjective: Patient doing well.  Still seems to be a bit dry based on furrowed tongue and skin turgor, but urine output has been great!  Objective: Vital signs in last 24 hours: Temp:  [98.1 F (36.7 C)-98.6 F (37 C)] 98.1 F (36.7 C) (09/05 0517) Pulse Rate:  [74-90] 77 (09/05 0517) Resp:  [17-18] 18 (09/05 0517) BP: (99-116)/(69-78) 116/78 (09/05 0517) SpO2:  [99 %-100 %] 99 % (09/05 0517) Last BM Date: 06/02/17  Intake/Output from previous day: 09/04 0701 - 09/05 0700 In: 1960.8 [P.O.:660; I.V.:1300.8] Out: 5725 [Urine:4575; Stool:1150] Intake/Output this shift: Total I/O In: -  Out: 200 [Urine:200]  Lungs: Clear  Abd: Soft, great bowel sounds.  Ileostomy output 1.1 liter over the last 24 hours.  Extremities: No clinical signs or symptoms of DVT  Neuro: Intact  Skin:  Has a groin rash which seems like Candida--will start mycostatin powder  Lab Results: CBC   Recent Labs  06/01/17 0451 06/02/17 0448  WBC 10.7* 10.3  HGB 8.9* 8.8*  HCT 27.9* 27.6*  PLT 546* 480*   BMET  Recent Labs  06/01/17 0451 06/02/17 0448  NA 133* 137  K 3.6 3.9  CL 99* 105  CO2 26 26  GLUCOSE 103* 109*  BUN 14 11  CREATININE 0.73 0.85  CALCIUM 8.3* 8.5*   PT/INR No results for input(s): LABPROT, INR in the last 72 hours. ABG No results for input(s): PHART, HCO3 in the last 72 hours.  Invalid input(s): PCO2, PO2  Studies/Results: No results found.  Anti-infectives: Anti-infectives    Start     Dose/Rate Route Frequency Ordered Stop   06/03/17 0815  piperacillin-tazobactam (ZOSYN) IVPB 3.375 g  Status:  Discontinued    Comments:  Please discontinue after the next dose.   3.375 g 12.5 mL/hr over 240 Minutes Intravenous Every 8 hours 06/03/17 0812 06/03/17 0812   06/02/17 1000  piperacillin-tazobactam (ZOSYN) IVPB 3.375 g    Comments:  Please discontinue after the next dose.   3.375 g 12.5 mL/hr over 240 Minutes Intravenous Every 8 hours 06/02/17  0753 06/02/17 1415   05/24/17 1200  vancomycin (VANCOCIN) IVPB 1000 mg/200 mL premix  Status:  Discontinued     1,000 mg 200 mL/hr over 60 Minutes Intravenous Every 12 hours 05/24/17 0035 05/28/17 1054   05/24/17 0600  piperacillin-tazobactam (ZOSYN) IVPB 3.375 g  Status:  Discontinued     3.375 g 12.5 mL/hr over 240 Minutes Intravenous Every 8 hours 05/24/17 0035 06/02/17 0753   05/24/17 0045  piperacillin-tazobactam (ZOSYN) IVPB 3.375 g     3.375 g 100 mL/hr over 30 Minutes Intravenous  Once 05/24/17 0030 05/24/17 0134   05/24/17 0045  vancomycin (VANCOCIN) IVPB 1000 mg/200 mL premix  Status:  Discontinued     1,000 mg 200 mL/hr over 60 Minutes Intravenous  Once 05/24/17 0030 05/24/17 0035   05/24/17 0045  vancomycin (VANCOCIN) 2,000 mg in sodium chloride 0.9 % 500 mL IVPB     2,000 mg 250 mL/hr over 120 Minutes Intravenous  Once 05/24/17 0035 05/24/17 0335      Assessment/Plan: s/p Procedure(s): [place] for discharge tomorrow Stop IV fluids.  Mycostatin powder.  Check labs tomorrow.  LOS: 10 days    Kathryne Eriksson. Dahlia Bailiff, MD, FACS [phone] [facility] Surgery 06/03/2017

## 2017-06-03 NOTE — Consult Note (Addendum)
[place] Nurse wound follow up Surgical team following for assessment and plan of care for abd wound.  Vac has been discontinued and moist gauze dressing applied.  [place] Nurse ostomy follow up Stoma type/location: LLQ Ileostomy, os pointing downward.  Stomal assessment/size: 1 5/8" less edematous, cut off center to accommodate midline dressing easier. Treatment options for stomal/peristomal skin: barrier ring to assist with maintaining a seal Output 50 cc liquid green stool Ostomy pouching: 2pc. 2 3/4" pouch with barrier ring cut off center  Education provided: WIfe performs pouch change independently with barrier ring and cutting off center. Educational materials in the room and 5 sets of extra wafers, pouches, and barrier rings ordered to the room for use at discharge.  Reviewed pouching routines and ordering supplies and dietary precautions. Wife is able to roll close, empty and perform pouch change today and states she will be performing pouch application and emptying until patient feels stronger after discharge.  They deny further questions.  Enrolled patient in [place] Start Discharge program: Yes Please re-consult if further assistance is needed.  Thank-you,  [name] MSN, [place], [place], [place], [place]

## 2017-06-04 LAB — BASIC METABOLIC PANEL
ANION GAP: 6 (ref 5–15)
BUN: 11 mg/dL (ref 6–20)
CALCIUM: 8.7 mg/dL — AB (ref 8.9–10.3)
CO2: 24 mmol/L (ref 22–32)
CREATININE: 0.81 mg/dL (ref 0.61–1.24)
Chloride: 106 mmol/L (ref 101–111)
GFR calc non Af Amer: >60 mL/min (ref >60)
Glucose, Bld: 90 mg/dL (ref 65–99)
Potassium: 3.9 mmol/L (ref 3.5–5.1)
SODIUM: 136 mmol/L (ref 135–145)

## 2017-06-04 LAB — CBC WITH DIFFERENTIAL/PLATELET
BASOS ABS: 0 10*3/uL (ref 0.0–0.1)
BASOS PCT: 0 %
EOS ABS: 0.2 10*3/uL (ref 0.0–0.7)
Eosinophils Relative: 1 %
HEMATOCRIT: 27.1 % — AB (ref 39.0–52.0)
HEMOGLOBIN: 8.6 g/dL — AB (ref 13.0–17.0)
Lymphocytes Relative: 11 %
Lymphs Abs: 1.3 10*3/uL (ref 0.7–4.0)
MCH: 25.5 pg — ABNORMAL LOW (ref 26.0–34.0)
MCHC: 31.7 g/dL (ref 30.0–36.0)
MCV: 80.4 fL (ref 78.0–100.0)
MONOS PCT: 10 %
Monocytes Absolute: 1.2 10*3/uL — ABNORMAL HIGH (ref 0.1–1.0)
NEUTROS ABS: 9.1 10*3/uL — AB (ref 1.7–7.7)
NEUTROS PCT: 78 %
Platelets: 467 10*3/uL — ABNORMAL HIGH (ref 150–400)
RBC: 3.37 MIL/uL — ABNORMAL LOW (ref 4.22–5.81)
RDW: 14.7 % (ref 11.5–15.5)
WBC: 11.7 10*3/uL — ABNORMAL HIGH (ref 4.0–10.5)

## 2017-06-04 MED ORDER — LOPERAMIDE HCL 2 MG PO CAPS: 2.0000 mg | ORAL_CAPSULE | Freq: Two times a day (BID) | ORAL | 3 refills | Status: AC

## 2017-06-04 MED ORDER — ADULT MULTIVITAMIN W/MINERALS CH: 1.0000 | ORAL_TABLET | Freq: Every day | ORAL | 3 refills | Status: AC

## 2017-06-04 MED ORDER — NYSTATIN 100000 UNIT/GM EX POWD: Freq: Two times a day (BID) | CUTANEOUS | 0 refills | Status: AC

## 2017-06-04 MED ORDER — TRAMADOL HCL 50 MG PO TABS: 50.0000 mg | ORAL_TABLET | Freq: Four times a day (QID) | ORAL | 0 refills | Status: DC | PRN

## 2017-06-04 MED ORDER — FERROUS GLUCONATE 324 (38 FE) MG PO TABS: 324.0000 mg | ORAL_TABLET | Freq: Three times a day (TID) | ORAL | 3 refills | Status: AC

## 2017-06-04 NOTE — Discharge Summary (Signed)
Physician Discharge Summary  Patient ID: Douglas Warren MRN: [id number] DOB/AGE: Apr 07, 1951 66 y.o.  Admit date: 05/23/2017 Discharge date: 06/04/2017  Admission Diagnoses:  Discharge Diagnoses:  Active Problems:   History of surgery for acute abdominal pain   Leak of anastomosis between gastrointestinal structures   Discharged Condition: poor  Hospital Course: Patient readmitted after previous colectomy with anastomosis, had peritonitis with an anastomotic leak probably from the distal colorectal anastomosis.  Not clearly identified at the time of surgery.  No stoll peritonitis, mostly free air.  Consults: None  Significant Diagnostic Studies: labs: standard CBC/Bmet, microbiology: wound culture: Multiple organisms and radiology: CT scan: free air on admission  Treatments: IV hydration, antibiotics: vancomycin and Zosyn, TPN and surgery: diverting loop ileostomy  Discharge Exam: Blood pressure 130/76, pulse 84, temperature 98.2 F (36.8 C), temperature source Oral, resp. rate 17, height 5\' 9"  (1.753 m), weight 80.8 kg (178 lb 1.6 oz), SpO2 100 %. General appearance: alert, cooperative, appears stated age and no distress Head: Normocephalic, without obvious abnormality, atraumatic Eyes: negative findings: corneas clear, pupils equal, round, reactive to light and accomodation and visual fields full to confrontation Resp: clear to auscultation bilaterally GI: soft, non-tender; bowel sounds normal; no masses,  no organomegaly and Ileostomy functioning well with 1.0 liter of output in the last 24 hours. Extremities: extremities normal, atraumatic, no cyanosis or edema and Homans sign is negative, no sign of DVT  Disposition: 01-Home or Self Care  Discharge Instructions    Call MD for:  difficulty breathing, headache or visual disturbances    Complete by:  As directed    Call MD for:  extreme fatigue    Complete by:  As directed    Call MD for:  hives    Complete by:  As  directed    Call MD for:  persistant dizziness or light-headedness    Complete by:  As directed    Call MD for:  persistant nausea and vomiting    Complete by:  As directed    Call MD for:  redness, tenderness, or signs of infection (pain, swelling, redness, odor or green/yellow discharge around incision site)    Complete by:  As directed    Call MD for:  severe uncontrolled pain    Complete by:  As directed    Call MD for:  temperature >100.4    Complete by:  As directed    Diet - low sodium heart healthy    Complete by:  As directed    Discharge instructions    Complete by:  As directed    See specific instructions for ileostomy care   Discharge wound care:    Complete by:  As directed    Wet to dry with saline and 4x4 gauze daily.  Home Health nurse to see and assist with care.  May shower and pat dry wound.   Driving Restrictions    Complete by:  As directed    Two weeks   Increase activity slowly    Complete by:  As directed    Lifting restrictions    Complete by:  As directed    6 weeks nothing > 20 pounds     Allergies as of 06/04/2017   No Known Allergies     Medication List    STOP taking these medications   metoprolol tartrate 25 MG tablet Commonly known as:  LOPRESSOR   oxyCODONE 5 MG immediate release tablet Commonly known as:  Oxy IR/ROXICODONE  TAKE these medications   cholecalciferol 1000 units tablet Commonly known as:  VITAMIN D Take 1,000 Units by mouth daily.   ferrous gluconate 324 MG tablet Commonly known as:  FERGON Take 1 tablet (324 mg total) by mouth 3 (three) times daily with meals.   gabapentin 300 MG capsule Commonly known as:  NEURONTIN Take 1 capsule (300 mg total) by mouth 2 (two) times daily.   loperamide 2 MG capsule Commonly known as:  IMODIUM Take 1 capsule (2 mg total) by mouth 2 (two) times daily.   multivitamin with minerals Tabs tablet Take 1 tablet by mouth daily.   nystatin powder Commonly known as:   MYCOSTATIN/NYSTOP Apply topically 2 (two) times daily.   ondansetron 4 MG tablet Commonly known as:  ZOFRAN Take 1 tablet (4 mg total) by mouth every 8 (eight) hours as needed for nausea.   PROBIOTIC DAILY PO Take 1 capsule by mouth daily.   traMADol 50 MG tablet Commonly known as:  ULTRAM Take 1-2 tablets (50-100 mg total) by mouth every 6 (six) hours as needed for moderate pain.            Discharge Care Instructions        Start     Ordered   06/04/17 0000  ferrous gluconate (FERGON) 324 MG tablet  3 times daily with meals     06/04/17 0750   06/04/17 0000  loperamide (IMODIUM) 2 MG capsule  2 times daily     06/04/17 0750   06/04/17 0000  Multiple Vitamin (MULTIVITAMIN WITH MINERALS) TABS tablet  Daily     06/04/17 0750   06/04/17 0000  nystatin (MYCOSTATIN/NYSTOP) powder  2 times daily     06/04/17 0750   06/04/17 0000  traMADol (ULTRAM) 50 MG tablet  Every 6 hours PRN     06/04/17 0750   06/04/17 0000  Diet - low sodium heart healthy     06/04/17 0750   06/04/17 0000  Increase activity slowly     06/04/17 0750   06/04/17 0000  Discharge instructions    Comments:  See specific instructions for ileostomy care   06/04/17 0750   06/04/17 0000  Discharge wound care:    Comments:  Wet to dry with saline and 4x4 gauze daily.  Home Health nurse to see and assist with care.  May shower and pat dry wound.   06/04/17 0750   06/04/17 0000  Driving Restrictions    Comments:  Two weeks   06/04/17 0750   06/04/17 0000  Lifting restrictions    Comments:  6 weeks nothing > 20 pounds   06/04/17 0750   06/04/17 0000  Call MD for:  extreme fatigue     06/04/17 0750   06/04/17 0000  Call MD for:  persistant dizziness or light-headedness     06/04/17 0750   06/04/17 0000  Call MD for:  hives     06/04/17 0750   06/04/17 0000  Call MD for:  difficulty breathing, headache or visual disturbances     06/04/17 0750   06/04/17 0000  Call MD for:  redness, tenderness, or signs of  infection (pain, swelling, redness, odor or green/yellow discharge around incision site)     06/04/17 0750   06/04/17 0000  Call MD for:  severe uncontrolled pain     06/04/17 0750   06/04/17 0000  Call MD for:  persistant nausea and vomiting     06/04/17 0750   06/04/17 0000  Call MD for:  temperature >100.4     06/04/17 0750     Follow-up Information    Judeth Horn, MD. Schedule an appointment as soon as possible for a visit on 06/09/2017.   Specialty:  General Surgery Why:  Talk to Sharyn Lull to get specific time for appointment Contact information: [address] [place] [address] [phone]           Signed: Judeth Horn 06/04/2017, 8:02 AM

## 2017-06-04 NOTE — Progress Notes (Signed)
Patient discharged to home with with instructions and prescriptions, some drsg and ostomy supplies.

## 2017-06-04 NOTE — Discharge Instructions (Signed)
End Ileostomy, Care After Refer to this sheet in the next few weeks. These instructions provide you with information about caring for yourself after your procedure. Your health care provider may also give you more specific instructions. Your treatment has been planned according to current medical practices, but problems sometimes occur. Call your health care provider if you have any problems or questions after your procedure. What can I expect after the procedure? After the procedure, it is common to have:  A small amount of blood or clear fluid leaking from your stoma.  Pain and discomfort in your abdomen, especially around your stoma.  Irregular bowel movements for several days.  Loose stool.  If the output gets to be > 2.0 liters per day for two consecutive days give [place] a call  Follow these instructions at home: Medicines  Take over-the-counter and prescription medicines only as told by your health care provider.  If you were prescribed an antibiotic medicine, take it as told by your health care provider. Do not stop taking the antibiotic even if you start to feel better. Stoma Care  Keep your stoma and the skin that surrounds it clean and dry.  Follow your health care providers instructions about how to take care of your stoma. It is important to: ? Wash your hands with soap and water before you change your bandage (dressing). If soap and water are not available, use hand sanitizer. ? Change your dressing as told by your health care provider. ? Leave stitches (sutures), skin glue, or adhesive strips in place. In some cases, these skin closures may need to be in place for 2 weeks or longer. If adhesive strip edges start to loosen and curl up, you may trim the loose edges. Do not remove adhesive strips entirely unless your health care provider tells you to remove them.  Check your stoma area every day for signs of infection. Check for: ? More redness, swelling, or pain. ? More  fluid or blood. ? Warmth. ? Pus or a bad smell.  Follow your health care providers instructions about changing and cleaning your ostomy pouch.  Keep supplies with you at all times to care for your stoma and ostomy pouch. Eating and drinking   Follow instructions from your health care provider about eating or drinking restrictions.  Pay attention to which foods and drinks cause problems with digestion, such as gas, constipation, or diarrhea.  Avoid spicy foods and caffeine while your stoma heals.  Eat meals and snacks at regular intervals.  Drink enough fluid to keep your urine clear or pale yellow. Activity  Return to your normal activities as told by your health care provider. Ask your health care provider what activities are safe for you.  Rest as much as possible while your stoma heals.  Avoid intense physical activity for as long as you are told by your health care provider.  Do not lift anything that is heavier than 10 lb (4.5 kg) for 6 weeks or for as long as told by your health care provider. Driving  Do not drive for 24 hours if you received a sedative.  Do not drive or operate heavy machinery while taking prescription pain medicine. General instructions   Wear compression stockings as told by your health care provider. These stockings help to prevent blood clots and reduce swelling in your legs.  Do not take baths, swim, or use a hot tub until your health care provider approves.  Do not use any tobacco products, such  as cigarettes, chewing tobacco, and e-cigarettes. If you need help quitting, ask your health care provider.  (Women) Talk with your health care provider if you plan to become pregnant or if you take birth control pills.  Keep all follow-up visits as told by your health care provider. This is important. Contact a health care provider if:  You have more redness, swelling, or pain around your stoma.  You have more fluid or blood coming from your  stoma.  Your stoma feels warm to the touch.  You have pus or a bad smell coming from your stoma.  You have a fever.  You have loose stools that do not get firmer after several weeks.  You have bowel movements more often or less often than your health care provider tells you to expect.  You feel nauseous.  You vomit.  You have abdominal pain, bloating, pressure, or cramping.  You have problems with sexual activity.  You have an unusual lack of energy (fatigue).  You are unusually thirsty or you always have a dry mouth. Get help right away if:  You feel dizzy or light-headed.  You have pain or cramps in your abdomen that get worse or do not go away with medicine.  Your stoma suddenly changes size or color.  You have shortness of breath.  You have bleeding from your stoma that does not stop.  You vomit more than one time.  You faint.  You have internal tissue coming out of your stoma (prolapse).  You have an irregular heartbeat.  You have chest pain. This information is not intended to replace advice given to you by your health care provider. Make sure you discuss any questions you have with your health care provider.  Kathryne Eriksson. Dahlia Bailiff, MD, [place] [phone] [facility]

## 2017-06-04 NOTE — Progress Notes (Signed)
PT Cancellation Note  Patient Details Name: Douglas Warren MRN: [id number] DOB: 1951/07/27   Cancelled Treatment:    Reason Eval/Treat Not Completed: Other (comment). Checked in and pt initially eating breakfast, checked in again and pt declined mobility secondary to fatigue post-medication. Since pt plans to d/c this afternoon, answered pt and wife's questions regarding safe mobility at home. Will plan to follow-up for a treatment session as time allows today.  Mabeline Caras, PT, DPT Acute Rehab Services  Pager: [place] 06/04/2017, 11:11 AM

## 2017-06-05 DIAGNOSIS — K658 Other peritonitis: Secondary | ICD-10-CM | POA: Diagnosis not present

## 2017-06-05 DIAGNOSIS — K9189 Other postprocedural complications and disorders of digestive system: Secondary | ICD-10-CM | POA: Diagnosis not present

## 2017-06-05 DIAGNOSIS — K5790 Diverticulosis of intestine, part unspecified, without perforation or abscess without bleeding: Secondary | ICD-10-CM | POA: Diagnosis not present

## 2017-06-05 DIAGNOSIS — D649 Anemia, unspecified: Secondary | ICD-10-CM | POA: Diagnosis not present

## 2017-06-05 DIAGNOSIS — Z432 Encounter for attention to ileostomy: Secondary | ICD-10-CM | POA: Diagnosis not present

## 2017-06-05 DIAGNOSIS — K668 Other specified disorders of peritoneum: Secondary | ICD-10-CM | POA: Diagnosis not present

## 2017-06-09 DIAGNOSIS — D126 Benign neoplasm of colon, unspecified: Secondary | ICD-10-CM | POA: Diagnosis not present

## 2017-06-09 DIAGNOSIS — K9189 Other postprocedural complications and disorders of digestive system: Secondary | ICD-10-CM | POA: Diagnosis not present

## 2017-06-09 DIAGNOSIS — K658 Other peritonitis: Secondary | ICD-10-CM | POA: Diagnosis not present

## 2017-06-09 DIAGNOSIS — K668 Other specified disorders of peritoneum: Secondary | ICD-10-CM | POA: Diagnosis not present

## 2017-06-09 DIAGNOSIS — K5790 Diverticulosis of intestine, part unspecified, without perforation or abscess without bleeding: Secondary | ICD-10-CM | POA: Diagnosis not present

## 2017-06-09 DIAGNOSIS — D649 Anemia, unspecified: Secondary | ICD-10-CM | POA: Diagnosis not present

## 2017-06-09 DIAGNOSIS — Z432 Encounter for attention to ileostomy: Secondary | ICD-10-CM | POA: Diagnosis not present

## 2017-06-15 DIAGNOSIS — Z932 Ileostomy status: Secondary | ICD-10-CM | POA: Diagnosis not present

## 2017-06-16 DIAGNOSIS — Z432 Encounter for attention to ileostomy: Secondary | ICD-10-CM | POA: Diagnosis not present

## 2017-06-16 DIAGNOSIS — K668 Other specified disorders of peritoneum: Secondary | ICD-10-CM | POA: Diagnosis not present

## 2017-06-16 DIAGNOSIS — K9189 Other postprocedural complications and disorders of digestive system: Secondary | ICD-10-CM | POA: Diagnosis not present

## 2017-06-16 DIAGNOSIS — K658 Other peritonitis: Secondary | ICD-10-CM | POA: Diagnosis not present

## 2017-06-16 DIAGNOSIS — D649 Anemia, unspecified: Secondary | ICD-10-CM | POA: Diagnosis not present

## 2017-06-16 DIAGNOSIS — K5790 Diverticulosis of intestine, part unspecified, without perforation or abscess without bleeding: Secondary | ICD-10-CM | POA: Diagnosis not present

## 2017-06-22 DIAGNOSIS — Z932 Ileostomy status: Secondary | ICD-10-CM | POA: Diagnosis not present

## 2017-06-27 DIAGNOSIS — D649 Anemia, unspecified: Secondary | ICD-10-CM | POA: Diagnosis not present

## 2017-06-27 DIAGNOSIS — Z432 Encounter for attention to ileostomy: Secondary | ICD-10-CM | POA: Diagnosis not present

## 2017-06-27 DIAGNOSIS — K9189 Other postprocedural complications and disorders of digestive system: Secondary | ICD-10-CM | POA: Diagnosis not present

## 2017-06-27 DIAGNOSIS — K5790 Diverticulosis of intestine, part unspecified, without perforation or abscess without bleeding: Secondary | ICD-10-CM | POA: Diagnosis not present

## 2017-06-27 DIAGNOSIS — K658 Other peritonitis: Secondary | ICD-10-CM | POA: Diagnosis not present

## 2017-06-27 DIAGNOSIS — K668 Other specified disorders of peritoneum: Secondary | ICD-10-CM | POA: Diagnosis not present

## 2017-07-01 DIAGNOSIS — K5790 Diverticulosis of intestine, part unspecified, without perforation or abscess without bleeding: Secondary | ICD-10-CM | POA: Diagnosis not present

## 2017-07-01 DIAGNOSIS — Z432 Encounter for attention to ileostomy: Secondary | ICD-10-CM | POA: Diagnosis not present

## 2017-07-01 DIAGNOSIS — D649 Anemia, unspecified: Secondary | ICD-10-CM | POA: Diagnosis not present

## 2017-07-01 DIAGNOSIS — K9189 Other postprocedural complications and disorders of digestive system: Secondary | ICD-10-CM | POA: Diagnosis not present

## 2017-07-01 DIAGNOSIS — K658 Other peritonitis: Secondary | ICD-10-CM | POA: Diagnosis not present

## 2017-07-01 DIAGNOSIS — K668 Other specified disorders of peritoneum: Secondary | ICD-10-CM | POA: Diagnosis not present

## 2017-07-07 DIAGNOSIS — Z932 Ileostomy status: Secondary | ICD-10-CM | POA: Diagnosis not present

## 2017-07-08 DIAGNOSIS — K668 Other specified disorders of peritoneum: Secondary | ICD-10-CM | POA: Diagnosis not present

## 2017-07-08 DIAGNOSIS — D649 Anemia, unspecified: Secondary | ICD-10-CM | POA: Diagnosis not present

## 2017-07-08 DIAGNOSIS — K9189 Other postprocedural complications and disorders of digestive system: Secondary | ICD-10-CM | POA: Diagnosis not present

## 2017-07-08 DIAGNOSIS — K658 Other peritonitis: Secondary | ICD-10-CM | POA: Diagnosis not present

## 2017-07-08 DIAGNOSIS — Z432 Encounter for attention to ileostomy: Secondary | ICD-10-CM | POA: Diagnosis not present

## 2017-07-08 DIAGNOSIS — K5790 Diverticulosis of intestine, part unspecified, without perforation or abscess without bleeding: Secondary | ICD-10-CM | POA: Diagnosis not present

## 2017-07-13 ENCOUNTER — Other Ambulatory Visit: Payer: Self-pay | Admitting: General Surgery

## 2017-07-13 DIAGNOSIS — Z932 Ileostomy status: Secondary | ICD-10-CM

## 2017-07-17 DIAGNOSIS — K5790 Diverticulosis of intestine, part unspecified, without perforation or abscess without bleeding: Secondary | ICD-10-CM | POA: Diagnosis not present

## 2017-07-17 DIAGNOSIS — K658 Other peritonitis: Secondary | ICD-10-CM | POA: Diagnosis not present

## 2017-07-17 DIAGNOSIS — K9189 Other postprocedural complications and disorders of digestive system: Secondary | ICD-10-CM | POA: Diagnosis not present

## 2017-07-17 DIAGNOSIS — Z432 Encounter for attention to ileostomy: Secondary | ICD-10-CM | POA: Diagnosis not present

## 2017-07-17 DIAGNOSIS — D649 Anemia, unspecified: Secondary | ICD-10-CM | POA: Diagnosis not present

## 2017-07-17 DIAGNOSIS — K668 Other specified disorders of peritoneum: Secondary | ICD-10-CM | POA: Diagnosis not present

## 2017-07-21 DIAGNOSIS — K5790 Diverticulosis of intestine, part unspecified, without perforation or abscess without bleeding: Secondary | ICD-10-CM | POA: Diagnosis not present

## 2017-07-21 DIAGNOSIS — Z432 Encounter for attention to ileostomy: Secondary | ICD-10-CM | POA: Diagnosis not present

## 2017-07-21 DIAGNOSIS — K668 Other specified disorders of peritoneum: Secondary | ICD-10-CM | POA: Diagnosis not present

## 2017-07-21 DIAGNOSIS — K658 Other peritonitis: Secondary | ICD-10-CM | POA: Diagnosis not present

## 2017-07-21 DIAGNOSIS — D649 Anemia, unspecified: Secondary | ICD-10-CM | POA: Diagnosis not present

## 2017-07-21 DIAGNOSIS — K9189 Other postprocedural complications and disorders of digestive system: Secondary | ICD-10-CM | POA: Diagnosis not present

## 2017-07-29 ENCOUNTER — Ambulatory Visit
Admission: RE | Admit: 2017-07-29 | Discharge: 2017-07-29 | Disposition: A | Discharge status: Home / Self Care | Payer: Medicare Other | Source: Ambulatory Visit | Attending: General Surgery | Admitting: General Surgery

## 2017-07-29 ENCOUNTER — Ambulatory Visit: Payer: Self-pay | Admitting: General Surgery

## 2017-07-29 DIAGNOSIS — Z932 Ileostomy status: Secondary | ICD-10-CM | POA: Diagnosis not present

## 2017-08-03 ENCOUNTER — Other Ambulatory Visit: Payer: Medicare Other

## 2017-08-04 ENCOUNTER — Other Ambulatory Visit: Payer: Medicare Other

## 2017-08-05 DIAGNOSIS — H35372 Puckering of macula, left eye: Secondary | ICD-10-CM | POA: Diagnosis not present

## 2017-08-05 DIAGNOSIS — H2513 Age-related nuclear cataract, bilateral: Secondary | ICD-10-CM | POA: Diagnosis not present

## 2017-08-05 DIAGNOSIS — H43812 Vitreous degeneration, left eye: Secondary | ICD-10-CM | POA: Diagnosis not present

## 2017-08-05 DIAGNOSIS — H34812 Central retinal vein occlusion, left eye, with macular edema: Secondary | ICD-10-CM | POA: Diagnosis not present

## 2017-08-26 DIAGNOSIS — D126 Benign neoplasm of colon, unspecified: Secondary | ICD-10-CM | POA: Diagnosis not present

## 2017-08-31 NOTE — Pre-Procedure Instructions (Signed)
[place]  08/31/2017      [facility], [address] [place] [address] Phone: [phone] Fax: [phone]    Your procedure is scheduled on Monday, September 07, 2017  Report to [facility] Admitting Entrance "A" at 5:30AM   Call this number if you have problems the morning of surgery:  [phone]   Remember:  Do not eat food or drink liquids after midnight.  Take these medicines the morning of surgery with A SIP OF WATER:NONE Please take all medicines for the bowel prep as prescribed.  Please drink plenty of clear liquids on the day of the bowel prep to prevent dehydration. Please complete your PRE-SURGERY ENSURE that was given to before you leave your house the morning of surgery.  Please, if able, drink it in one setting. DO NOT SIP.  As of today, stop taking all Aspirins, Vitamins, Fish oils, and Herbal medications. Also stop all NSAIDS i.e. Advil, Ibuprofen, Motrin, Aleve, Anaprox, Naproxen, BC and Goody Powders.   Do not wear jewelry.  Do not wear lotions, powders, colognes, or deodorant.  Do not shave 48 hours prior to surgery.  Men may shave face and neck.  Do not bring valuables to the hospital.  [facility] is not responsible for any belongings or valuables.  Contacts, dentures or bridgework may not be worn into surgery.  Leave your suitcase in the car.  After surgery it may be brought to your room.  For patients admitted to the hospital, discharge time will be determined by your treatment team.  Patients discharged the day of surgery will not be allowed to drive home.   Special instructions:   [facility]- Preparing For Surgery  Before surgery, you can play an important role. Because skin is not sterile, your skin needs to be as free of germs as possible. You can reduce the number of germs on your skin by washing with CHG (chlorahexidine gluconate) Soap before surgery.  CHG is  an antiseptic cleaner which kills germs and bonds with the skin to continue killing germs even after washing.  Please do not use if you have an allergy to CHG or antibacterial soaps. If your skin becomes reddened/irritated stop using the CHG.  Do not shave (including legs and underarms) for at least 48 hours prior to first CHG shower. It is OK to shave your face.  Please follow these instructions carefully.   1. Shower the NIGHT BEFORE SURGERY and the MORNING OF SURGERY with CHG.   2. If you chose to wash your hair, wash your hair first as usual with your normal shampoo.  3. After you shampoo, rinse your hair and body thoroughly to remove the shampoo.  4. Use CHG as you would any other liquid soap. You can apply CHG directly to the skin and wash gently with a scrungie or a clean washcloth.   5. Apply the CHG Soap to your body ONLY FROM THE NECK DOWN.  Do not use on open wounds or open sores. Avoid contact with your eyes, ears, mouth and genitals (private parts). Wash Face and genitals (private parts)  with your normal soap.  6. Wash thoroughly, paying special attention to the area where your surgery will be performed.  7. Thoroughly rinse your body with warm water from the neck down.  8. DO NOT shower/wash with your normal soap after using and rinsing off the CHG Soap.  9. [place]  yourself dry with a CLEAN TOWEL.  10. Wear CLEAN PAJAMAS to bed the night before surgery, wear comfortable clothes the morning of surgery  11. Place CLEAN SHEETS on your bed the night of your first shower and DO NOT SLEEP WITH PETS.  Day of Surgery: Do not apply any deodorants/lotions. Please wear clean clothes to the hospital/surgery center.    Please read over the following fact sheets that you were given. Pain Booklet, Coughing and Deep Breathing and Surgical Site Infection Prevention

## 2017-09-01 ENCOUNTER — Other Ambulatory Visit: Payer: Self-pay

## 2017-09-01 ENCOUNTER — Encounter (HOSPITAL_COMMUNITY)
Admission: RE | Admit: 2017-09-01 | Discharge: 2017-09-01 | Disposition: A | Discharge status: Home / Self Care | Payer: Medicare Other | Source: Ambulatory Visit | Attending: General Surgery | Admitting: General Surgery

## 2017-09-01 ENCOUNTER — Encounter (HOSPITAL_COMMUNITY): Payer: Self-pay

## 2017-09-01 DIAGNOSIS — Z01818 Encounter for other preprocedural examination: Secondary | ICD-10-CM | POA: Insufficient documentation

## 2017-09-01 LAB — CBC WITH DIFFERENTIAL/PLATELET
BASOS ABS: 0 10*3/uL (ref 0.0–0.1)
Basophils Relative: 0 %
EOS PCT: 1 %
Eosinophils Absolute: 0.1 10*3/uL (ref 0.0–0.7)
HCT: 41.6 % (ref 39.0–52.0)
HEMOGLOBIN: 13.4 g/dL (ref 13.0–17.0)
LYMPHS ABS: 1.5 10*3/uL (ref 0.7–4.0)
LYMPHS PCT: 21 %
MCH: 25.9 pg — AB (ref 26.0–34.0)
MCHC: 32.2 g/dL (ref 30.0–36.0)
MCV: 80.3 fL (ref 78.0–100.0)
Monocytes Absolute: 0.6 10*3/uL (ref 0.1–1.0)
Monocytes Relative: 8 %
Neutro Abs: 4.8 10*3/uL (ref 1.7–7.7)
Neutrophils Relative %: 70 %
PLATELETS: 261 10*3/uL (ref 150–400)
RBC: 5.18 MIL/uL (ref 4.22–5.81)
RDW: 13.8 % (ref 11.5–15.5)
WBC: 6.9 10*3/uL (ref 4.0–10.5)

## 2017-09-01 LAB — COMPREHENSIVE METABOLIC PANEL
ALK PHOS: 78 U/L (ref 38–126)
ALT: 27 U/L (ref 17–63)
AST: 23 U/L (ref 15–41)
Albumin: 3.5 g/dL (ref 3.5–5.0)
Anion gap: 7 (ref 5–15)
BUN: 15 mg/dL (ref 6–20)
CALCIUM: 9.2 mg/dL (ref 8.9–10.3)
CHLORIDE: 108 mmol/L (ref 101–111)
CO2: 24 mmol/L (ref 22–32)
CREATININE: 1.26 mg/dL — AB (ref 0.61–1.24)
GFR, EST NON AFRICAN AMERICAN: 58 mL/min — AB (ref >60)
Glucose, Bld: 94 mg/dL (ref 65–99)
Potassium: 4.2 mmol/L (ref 3.5–5.1)
Sodium: 139 mmol/L (ref 135–145)
Total Bilirubin: 0.8 mg/dL (ref 0.3–1.2)
Total Protein: 7.2 g/dL (ref 6.5–8.1)

## 2017-09-01 LAB — HEMOGLOBIN A1C
HEMOGLOBIN A1C: 5.4 % (ref 4.8–5.6)
Mean Plasma Glucose: 108.28 mg/dL

## 2017-09-01 NOTE — Progress Notes (Signed)
PCP - Dr. Karlton Lemon  Cardiologist - Denies  Chest x-ray - 05/24/17 (E)  EKG - 09/01/17  Stress Test - 11/19/15 (CE)  ECHO - Denies  Cardiac Cath - Denies  Sleep Study - No CPAP - None  LABS- Pending:CBC w/D, CMP, HA1C  HA1C-Pending  Chart will be given to anesthesia for review due to EKG result.  Pt denies having chest pain, sob, or fever at this time. All instructions explained to the pt, with a verbal understanding of the material. Pt agrees to go over the instructions while at home for a better understanding. The opportunity to ask questions was provided.

## 2017-09-02 ENCOUNTER — Encounter (HOSPITAL_COMMUNITY): Payer: Self-pay | Admitting: Emergency Medicine

## 2017-09-02 DIAGNOSIS — H34812 Central retinal vein occlusion, left eye, with macular edema: Secondary | ICD-10-CM | POA: Diagnosis not present

## 2017-09-02 NOTE — Progress Notes (Signed)
Anesthesia Chart Review:  Pt is a 66 year old male scheduled for reversal of loop ileostomy on 09/07/2017 with Judeth Horn, MD.   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 Never smoker. BMI 28.    Medications include: Flagyl  BP 108/82   Pulse 71   Temp 36.8 C (Oral)   Resp 20   Ht 5\' 9"  (1.753 m)   Wt 188 lb 6.4 oz (85.5 kg)   SpO2 99%   BMI 27.82 kg/m   Preoperative labs reviewed.    CXR 05/24/17:  1. There appears be a small amount of free air beneath the diaphragm. The patient underwent left hemicolectomy on 05/12/2017 and findings should have resolved by now. CT is recommended with IV and oral contrast for better assessment.  2. Bibasilar atelectasis is noted.  EKG 09/01/17: NSR. Possible Inferior infarct, age undetermined. No significant change since last tracing 26-Aug 2018  CT abdomen, pelvis 05/24/17:  1. Moderate-large volume free intra-abdominal air, scattered throughout the abdomen and pelvis. Suspect site of perforation is at the a sigmoid anastomosis as there is an adjacent presacral abscess measuring 5.8 x 3.1 cm. 2. Probable reactive ileus with dilated fluid in contrast filled small bowel.  Nuclear stress test 11/19/15 (care everywhere):  - Mild fixed thinning of the inferior wall. Otherwise, negative Cardiolite exam with no evidence of ischemia. The ejection fraction is 64%.  If no changes, I anticipate pt can proceed with surgery as scheduled.   Willeen Cass, FNP-BC [facility]/Anesthesiology Phone: [phone] 09/02/2017 12:47 PM

## 2017-09-04 DIAGNOSIS — Z800 Family history of malignant neoplasm of digestive organs: Secondary | ICD-10-CM | POA: Diagnosis not present

## 2017-09-04 DIAGNOSIS — D123 Benign neoplasm of transverse colon: Secondary | ICD-10-CM | POA: Diagnosis not present

## 2017-09-04 DIAGNOSIS — Z8719 Personal history of other diseases of the digestive system: Secondary | ICD-10-CM | POA: Diagnosis not present

## 2017-09-04 DIAGNOSIS — Z932 Ileostomy status: Secondary | ICD-10-CM | POA: Diagnosis not present

## 2017-09-06 NOTE — Anesthesia Preprocedure Evaluation (Addendum)
Anesthesia Evaluation  Patient identified by MRN, date of birth, ID band Patient awake    Reviewed: Allergy & Precautions, NPO status , Patient's Chart, lab work & pertinent test results  Airway Mallampati: I  TM Distance: >3 FB Neck ROM: Full    Dental  (+) Teeth Intact, Dental Advisory Given   Pulmonary neg pulmonary ROS,    Pulmonary exam normal breath sounds clear to auscultation       Cardiovascular negative cardio ROS Normal cardiovascular exam Rhythm:Regular Rate:Normal     Neuro/Psych negative neurological ROS  negative psych ROS   GI/Hepatic Neg liver ROS, colon mass (s/p L colectomy 05/12/17 and s/p exploratory laparotomy 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  Reproductive/Obstetrics                            Anesthesia Physical Anesthesia Plan  ASA: II  Anesthesia Plan: General   Post-op Pain Management:    Induction: Intravenous  PONV Risk Score and Plan: 3 and Midazolam, Dexamethasone and Ondansetron  Airway Management Planned: Oral ETT  Additional Equipment:   Intra-op Plan:   Post-operative Plan: Extubation in OR  Informed Consent: I have reviewed the patients History and Physical, chart, labs and discussed the procedure including the risks, benefits and alternatives for the proposed anesthesia with the patient or authorized representative who has indicated his/her understanding and acceptance.   Dental advisory given  Plan Discussed with: CRNA  Anesthesia Plan Comments: (Risks/benefits of general anesthesia discussed with patient including risk of damage to teeth, lips, gum, and tongue, nausea/vomiting, allergic  reactions to medications, and the possibility of heart attack, stroke and death.  All patient questions answered.  Patient wishes to proceed.)       Anesthesia Quick Evaluation

## 2017-09-07 ENCOUNTER — Inpatient Hospital Stay (HOSPITAL_COMMUNITY)
Admission: RE | Admit: 2017-09-07 | Discharge: 2017-09-13 | DRG: 330 | Disposition: A | Discharge status: Home / Self Care | Payer: Medicare Other | Source: Ambulatory Visit | Attending: General Surgery | Admitting: General Surgery

## 2017-09-07 ENCOUNTER — Encounter (HOSPITAL_COMMUNITY): Admission: RE | Payer: Self-pay | Source: Ambulatory Visit

## 2017-09-07 ENCOUNTER — Encounter (HOSPITAL_COMMUNITY)
Admission: RE | Disposition: A | Discharge status: Home / Self Care | Payer: Self-pay | Source: Ambulatory Visit | Attending: General Surgery

## 2017-09-07 ENCOUNTER — Other Ambulatory Visit: Payer: Self-pay

## 2017-09-07 ENCOUNTER — Inpatient Hospital Stay (HOSPITAL_COMMUNITY): Payer: Medicare Other | Admitting: Anesthesiology

## 2017-09-07 ENCOUNTER — Encounter (HOSPITAL_COMMUNITY): Payer: Self-pay | Admitting: *Deleted

## 2017-09-07 ENCOUNTER — Inpatient Hospital Stay (HOSPITAL_COMMUNITY): Admission: RE | Admit: 2017-09-07 | Payer: Medicare Other | Source: Ambulatory Visit | Admitting: General Surgery

## 2017-09-07 DIAGNOSIS — K567 Ileus, unspecified: Secondary | ICD-10-CM | POA: Diagnosis not present

## 2017-09-07 DIAGNOSIS — Z432 Encounter for attention to ileostomy: Secondary | ICD-10-CM | POA: Diagnosis not present

## 2017-09-07 DIAGNOSIS — Z932 Ileostomy status: Secondary | ICD-10-CM | POA: Diagnosis not present

## 2017-09-07 HISTORY — PX: ILEOSTOMY CLOSURE: SHX1784

## 2017-09-07 LAB — CBC
HCT: 38.2 % — ABNORMAL LOW (ref 39.0–52.0)
Hemoglobin: 12.3 g/dL — ABNORMAL LOW (ref 13.0–17.0)
MCH: 25.8 pg — ABNORMAL LOW (ref 26.0–34.0)
MCHC: 32.2 g/dL (ref 30.0–36.0)
MCV: 80.1 fL (ref 78.0–100.0)
PLATELETS: 232 10*3/uL (ref 150–400)
RBC: 4.77 MIL/uL (ref 4.22–5.81)
RDW: 14 % (ref 11.5–15.5)
WBC: 10.5 10*3/uL (ref 4.0–10.5)

## 2017-09-07 LAB — CREATININE, SERUM: CREATININE: 0.96 mg/dL (ref 0.61–1.24)

## 2017-09-07 SURGERY — CLOSURE, ILEOSTOMY
Anesthesia: General | Site: Abdomen

## 2017-09-07 SURGERY — REVISION, ILEOSTOMY
Anesthesia: General

## 2017-09-07 MED ORDER — DEXAMETHASONE SODIUM PHOSPHATE 10 MG/ML IJ SOLN
INTRAMUSCULAR | Status: DC | PRN
  Administered 2017-09-07: 10 mg via INTRAVENOUS

## 2017-09-07 MED ORDER — PROPOFOL 10 MG/ML IV BOLUS
INTRAVENOUS | Status: DC | PRN
  Administered 2017-09-07: 150 mg via INTRAVENOUS

## 2017-09-07 MED ORDER — POVIDONE-IODINE 10 % EX OINT
TOPICAL_OINTMENT | CUTANEOUS | Status: DC | PRN
  Administered 2017-09-07: 1 via TOPICAL

## 2017-09-07 MED ORDER — ONDANSETRON HCL 4 MG PO TABS
4.0000 mg | ORAL_TABLET | Freq: Four times a day (QID) | ORAL | Status: DC | PRN
  Filled 2017-09-07: qty 1

## 2017-09-07 MED ORDER — PROCHLORPERAZINE MALEATE 10 MG PO TABS: 10.0000 mg | ORAL_TABLET | Freq: Four times a day (QID) | ORAL | Status: DC | PRN

## 2017-09-07 MED ORDER — DEXTROSE 5 % IV SOLN
2.0000 g | Freq: Two times a day (BID) | INTRAVENOUS | Status: AC
  Administered 2017-09-07: 2 g via INTRAVENOUS
  Filled 2017-09-07: qty 2

## 2017-09-07 MED ORDER — FENTANYL CITRATE (PF) 100 MCG/2ML IJ SOLN
25.0000 ug | INTRAMUSCULAR | Status: DC | PRN
Start: 2017-09-07 — End: 2017-09-07
  Administered 2017-09-07 (×3): 50 ug via INTRAVENOUS

## 2017-09-07 MED ORDER — ACETAMINOPHEN 500 MG PO TABS
1000.0000 mg | ORAL_TABLET | ORAL | Status: AC
  Administered 2017-09-07: 1000 mg via ORAL
  Filled 2017-09-07: qty 2

## 2017-09-07 MED ORDER — MIDAZOLAM HCL 2 MG/2ML IJ SOLN
INTRAMUSCULAR | Status: DC | PRN
  Administered 2017-09-07: 1 mg via INTRAVENOUS

## 2017-09-07 MED ORDER — KCL IN DEXTROSE-NACL 20-5-0.45 MEQ/L-%-% IV SOLN
INTRAVENOUS | Status: DC
  Administered 2017-09-07 – 2017-09-10 (×6): via INTRAVENOUS
  Filled 2017-09-07 (×7): qty 1000

## 2017-09-07 MED ORDER — CHLORHEXIDINE GLUCONATE CLOTH 2 % EX PADS: 6.0000 | MEDICATED_PAD | Freq: Once | CUTANEOUS | Status: DC

## 2017-09-07 MED ORDER — PROMETHAZINE HCL 25 MG/ML IJ SOLN: 6.2500 mg | INTRAMUSCULAR | Status: DC | PRN

## 2017-09-07 MED ORDER — POLYETHYLENE GLYCOL 3350 17 GM/SCOOP PO POWD
1.0000 | Freq: Once | ORAL | Status: DC
  Filled 2017-09-07: qty 255

## 2017-09-07 MED ORDER — LACTATED RINGERS IV SOLN
INTRAVENOUS | Status: DC | PRN
  Administered 2017-09-07 (×2): via INTRAVENOUS

## 2017-09-07 MED ORDER — ROCURONIUM BROMIDE 10 MG/ML (PF) SYRINGE
PREFILLED_SYRINGE | INTRAVENOUS | Status: DC | PRN
  Administered 2017-09-07 (×2): 50 mg via INTRAVENOUS

## 2017-09-07 MED ORDER — LIDOCAINE 2% (20 MG/ML) 5 ML SYRINGE
INTRAMUSCULAR | Status: DC | PRN
  Administered 2017-09-07: 100 mg via INTRAVENOUS

## 2017-09-07 MED ORDER — SACCHAROMYCES BOULARDII 250 MG PO CAPS
250.0000 mg | ORAL_CAPSULE | Freq: Two times a day (BID) | ORAL | Status: DC
  Administered 2017-09-07 – 2017-09-13 (×13): 250 mg via ORAL
  Filled 2017-09-07 (×13): qty 1

## 2017-09-07 MED ORDER — ZOLPIDEM TARTRATE 5 MG PO TABS: 5.0000 mg | ORAL_TABLET | Freq: Every evening | ORAL | Status: DC | PRN

## 2017-09-07 MED ORDER — ACETAMINOPHEN 500 MG PO TABS
1000.0000 mg | ORAL_TABLET | Freq: Four times a day (QID) | ORAL | Status: AC
  Administered 2017-09-07 – 2017-09-08 (×2): 1000 mg via ORAL
  Filled 2017-09-07 (×2): qty 2

## 2017-09-07 MED ORDER — FENTANYL CITRATE (PF) 100 MCG/2ML IJ SOLN
INTRAMUSCULAR | Status: AC
  Filled 2017-09-07: qty 2

## 2017-09-07 MED ORDER — GABAPENTIN 300 MG PO CAPS: 300.0000 mg | ORAL_CAPSULE | Freq: Two times a day (BID) | ORAL | Status: DC

## 2017-09-07 MED ORDER — 0.9 % SODIUM CHLORIDE (POUR BTL) OPTIME
TOPICAL | Status: DC | PRN
  Administered 2017-09-07 (×2): 1000 mL

## 2017-09-07 MED ORDER — SUGAMMADEX SODIUM 200 MG/2ML IV SOLN
INTRAVENOUS | Status: DC | PRN
  Administered 2017-09-07: 200 mg via INTRAVENOUS

## 2017-09-07 MED ORDER — ONDANSETRON HCL 4 MG/2ML IJ SOLN
4.0000 mg | Freq: Four times a day (QID) | INTRAMUSCULAR | Status: DC | PRN
  Administered 2017-09-08 – 2017-09-09 (×2): 4 mg via INTRAVENOUS
  Filled 2017-09-07 (×2): qty 2

## 2017-09-07 MED ORDER — POVIDONE-IODINE 10 % EX OINT
TOPICAL_OINTMENT | CUTANEOUS | Status: AC
  Filled 2017-09-07: qty 28.35

## 2017-09-07 MED ORDER — CELECOXIB 200 MG PO CAPS
200.0000 mg | ORAL_CAPSULE | Freq: Two times a day (BID) | ORAL | Status: DC
  Administered 2017-09-07: 200 mg via ORAL
  Filled 2017-09-07 (×7): qty 1

## 2017-09-07 MED ORDER — ENOXAPARIN SODIUM 30 MG/0.3ML ~~LOC~~ SOLN
30.0000 mg | SUBCUTANEOUS | Status: DC
  Administered 2017-09-08 – 2017-09-13 (×6): 30 mg via SUBCUTANEOUS
  Filled 2017-09-07 (×6): qty 0.3

## 2017-09-07 MED ORDER — HYDROMORPHONE HCL 1 MG/ML IJ SOLN
0.5000 mg | INTRAMUSCULAR | Status: DC | PRN
  Administered 2017-09-08: 0.5 mg via INTRAVENOUS
  Filled 2017-09-07: qty 1

## 2017-09-07 MED ORDER — PHENYLEPHRINE 40 MCG/ML (10ML) SYRINGE FOR IV PUSH (FOR BLOOD PRESSURE SUPPORT)
PREFILLED_SYRINGE | INTRAVENOUS | Status: DC | PRN
  Administered 2017-09-07: 80 ug via INTRAVENOUS

## 2017-09-07 MED ORDER — ENOXAPARIN SODIUM 40 MG/0.4ML ~~LOC~~ SOLN
40.0000 mg | SUBCUTANEOUS | Status: AC
  Administered 2017-09-07: 40 mg via SUBCUTANEOUS
  Filled 2017-09-07: qty 0.4

## 2017-09-07 MED ORDER — TRAMADOL HCL 50 MG PO TABS
50.0000 mg | ORAL_TABLET | Freq: Four times a day (QID) | ORAL | Status: DC | PRN
Start: 2017-09-07 — End: 2017-09-07

## 2017-09-07 MED ORDER — FENTANYL CITRATE (PF) 250 MCG/5ML IJ SOLN
INTRAMUSCULAR | Status: DC | PRN
  Administered 2017-09-07: 100 ug via INTRAVENOUS
  Administered 2017-09-07: 50 ug via INTRAVENOUS

## 2017-09-07 MED ORDER — ONDANSETRON HCL 4 MG/2ML IJ SOLN
INTRAMUSCULAR | Status: DC | PRN
  Administered 2017-09-07: 4 mg via INTRAVENOUS

## 2017-09-07 MED ORDER — CEFOTETAN DISODIUM-DEXTROSE 2-2.08 GM-%(50ML) IV SOLR
2.0000 g | INTRAVENOUS | Status: AC
  Administered 2017-09-07: 2 g via INTRAVENOUS
  Filled 2017-09-07: qty 50

## 2017-09-07 MED ORDER — GABAPENTIN 300 MG PO CAPS
300.0000 mg | ORAL_CAPSULE | ORAL | Status: AC
  Administered 2017-09-07: 300 mg via ORAL
  Filled 2017-09-07: qty 1

## 2017-09-07 MED ORDER — PROCHLORPERAZINE EDISYLATE 5 MG/ML IJ SOLN
5.0000 mg | Freq: Four times a day (QID) | INTRAMUSCULAR | Status: DC | PRN
  Administered 2017-09-09: 10 mg via INTRAVENOUS
  Filled 2017-09-07: qty 2

## 2017-09-07 SURGICAL SUPPLY — 45 items
BLADE CLIPPER SURG (BLADE) IMPLANT
CANISTER SUCT 3000ML PPV (MISCELLANEOUS) ×3 IMPLANT
COVER MAYO STAND STRL (DRAPES) ×3 IMPLANT
COVER SURGICAL LIGHT HANDLE (MISCELLANEOUS) ×6 IMPLANT
DRAIN PENROSE 1/4X12 LTX STRL (WOUND CARE) ×3 IMPLANT
DRAPE LAPAROSCOPIC ABDOMINAL (DRAPES) ×3 IMPLANT
DRAPE UTILITY XL STRL (DRAPES) ×6 IMPLANT
DRAPE WARM FLUID 44X44 (DRAPE) ×3 IMPLANT
ELECT CAUTERY BLADE 6.4 (BLADE) ×3 IMPLANT
ELECT REM PT RETURN 9FT ADLT (ELECTROSURGICAL) ×3
ELECTRODE REM PT RTRN 9FT ADLT (ELECTROSURGICAL) ×1 IMPLANT
GAUZE SPONGE 4X4 12PLY STRL (GAUZE/BANDAGES/DRESSINGS) ×3 IMPLANT
GLOVE BIOGEL PI IND STRL 8 (GLOVE) ×2 IMPLANT
GLOVE BIOGEL PI INDICATOR 8 (GLOVE) ×4
GLOVE ECLIPSE 7.5 STRL STRAW (GLOVE) ×6 IMPLANT
GOWN STRL REUS W/ TWL LRG LVL3 (GOWN DISPOSABLE) ×3 IMPLANT
GOWN STRL REUS W/TWL LRG LVL3 (GOWN DISPOSABLE) ×6
KIT BASIN OR (CUSTOM PROCEDURE TRAY) ×3 IMPLANT
KIT ROOM TURNOVER OR (KITS) ×3 IMPLANT
LIGASURE IMPACT 36 18CM CVD LR (INSTRUMENTS) IMPLANT
NS IRRIG 1000ML POUR BTL (IV SOLUTION) ×6 IMPLANT
PACK GENERAL/GYN (CUSTOM PROCEDURE TRAY) ×3 IMPLANT
PAD ARMBOARD 7.5X6 YLW CONV (MISCELLANEOUS) ×3 IMPLANT
PENCIL BUTTON HOLSTER BLD 10FT (ELECTRODE) ×3 IMPLANT
SPECIMEN JAR MEDIUM (MISCELLANEOUS) IMPLANT
SPONGE LAP 18X18 X RAY DECT (DISPOSABLE) IMPLANT
STAPLER GUN LINEAR PROX 60 (STAPLE) ×3 IMPLANT
STAPLER PROXIMATE 75MM BLUE (STAPLE) ×3 IMPLANT
STAPLER VISISTAT 35W (STAPLE) ×3 IMPLANT
SUCTION POOLE TIP (SUCTIONS) ×3 IMPLANT
SUT NOVA NAB DX-16 0-1 5-0 T12 (SUTURE) ×3 IMPLANT
SUT PDS AB 1 TP1 96 (SUTURE) ×6 IMPLANT
SUT SILK 2 0 (SUTURE) ×2
SUT SILK 2 0 SH (SUTURE) ×3 IMPLANT
SUT SILK 2 0 SH CR/8 (SUTURE) ×3 IMPLANT
SUT SILK 2-0 18XBRD TIE 12 (SUTURE) ×1 IMPLANT
SUT SILK 3 0 (SUTURE) ×2
SUT SILK 3 0 SH CR/8 (SUTURE) ×3 IMPLANT
SUT SILK 3-0 18XBRD TIE 12 (SUTURE) ×1 IMPLANT
TOWEL OR 17X24 6PK STRL BLUE (TOWEL DISPOSABLE) ×3 IMPLANT
TOWEL OR 17X26 10 PK STRL BLUE (TOWEL DISPOSABLE) ×3 IMPLANT
TRAY FOLEY W/METER SILVER 14FR (SET/KITS/TRAYS/PACK) ×3 IMPLANT
TUBING SUCTION BULK 100 FT (MISCELLANEOUS) ×3 IMPLANT
WATER STERILE IRR 1000ML POUR (IV SOLUTION) IMPLANT
YANKAUER SUCT BULB TIP NO VENT (SUCTIONS) ×3 IMPLANT

## 2017-09-07 NOTE — Plan of Care (Signed)
  Education: Knowledge of General Education information will improve 09/07/2017 2050 - Progressing by Irish Lack, RN   Clinical Measurements: Ability to maintain clinical measurements within normal limits will improve 09/07/2017 2050 - Progressing by Irish Lack, RN

## 2017-09-07 NOTE — Anesthesia Postprocedure Evaluation (Signed)
Anesthesia Post Note  Patient: Douglas Warren  Procedure(s) Performed: REVERSAL OF LOOP ILEOSTOMY (N/A Abdomen)     Patient location during evaluation: PACU Anesthesia Type: General Level of consciousness: awake and alert Pain management: pain level controlled Vital Signs Assessment: post-procedure vital signs reviewed and stable Respiratory status: spontaneous breathing, nonlabored ventilation and respiratory function stable Cardiovascular status: blood pressure returned to baseline and stable Postop Assessment: no apparent nausea or vomiting Anesthetic complications: no    Last Vitals:  Vitals:   09/07/17 1115 09/07/17 1214  BP: 124/82 122/76  Pulse: 90 94  Resp: 14 18  Temp: 36.6 C 36.6 C  SpO2: 96% 95%    Last Pain:  Vitals:   09/07/17 1214  TempSrc: Oral  PainSc:                  Catalina Gravel

## 2017-09-07 NOTE — Anesthesia Procedure Notes (Signed)
Procedure Name: Intubation Date/Time: 09/07/2017 7:32 AM Performed by: Dylyn Mclaren, Dereck Leep, CRNA Pre-anesthesia Checklist: Patient identified, Patient being monitored, Timeout performed, Emergency Drugs available and Suction available Patient Re-evaluated:Patient Re-evaluated prior to induction Oxygen Delivery Method: Circle System Utilized Preoxygenation: Pre-oxygenation with 100% oxygen Induction Type: IV induction Ventilation: Mask ventilation without difficulty Laryngoscope Size: Miller and 3 Grade View: Grade I Tube type: Oral Tube size: 8.0 mm Number of attempts: 1 Airway Equipment and Method: Stylet Placement Confirmation: ETT inserted through vocal cords under direct vision,  positive ETCO2 and breath sounds checked- equal and bilateral Secured at: 23 cm Tube secured with: Tape Dental Injury: Teeth and Oropharynx as per pre-operative assessment

## 2017-09-07 NOTE — Transfer of Care (Signed)
Immediate Anesthesia Transfer of Care Note  Patient: Douglas Warren  Procedure(s) Performed: REVERSAL OF LOOP ILEOSTOMY (N/A Abdomen)  Patient Location: PACU  Anesthesia Type:General  Level of Consciousness: awake, alert , oriented, drowsy and patient cooperative  Airway & Oxygen Therapy: Patient Spontanous Breathing and Patient connected to nasal cannula oxygen  Post-op Assessment: Report given to RN, Post -op Vital signs reviewed and stable and Patient moving all extremities X 4  Post vital signs: Reviewed and stable  Last Vitals:  Vitals:   09/07/17 0539  BP: 114/77  Pulse: 78  Resp: 20  Temp: 36.7 C  SpO2: 98%    Last Pain:  Vitals:   09/07/17 0539  TempSrc: Oral      Patients Stated Pain Goal: 3 (58/25/18 9842)  Complications: No apparent anesthesia complications

## 2017-09-07 NOTE — H&P (Signed)
Douglas Warren is an 66 y.o. male.   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  He seemed to be doing very well in the hospital, had had a bowel movement or several, was discharged to home, but was readmitted two days later after discharge with abdominal pain, free air and a likely anastomotic leak  He was taken back to the OR by Dr. Redmond Pulling on August 26 where the patient had extensive adhesions.  The exact site of leakage was not identified, and there was no fecal contamination, no soilage.  It was difficult, but a loop ileostomy was performed.  We are now planning on a reversal of his loop ileostomy after preoperative water-soluble contrast studies demonstrate no evidence of leakage at either site.  He has had some minor wound problems with stitch abscess and poor healing, but currently is doing well.  His wounds are completely healed.  Past Medical History:  Diagnosis Date  . CRVO (central retinal vein occlusion)    left eye  . Diverticulosis   . Inguinal hernia    right  . Mass of colon     x 2  . Pyloric stenosis   . Pyloric stenosis     Past Surgical History:  Procedure Laterality Date  . ABDOMINAL SURGERY     for pyloic stenosis at 6 months  . COLONOSCOPY    . COLONOSCOPY N/A 05/12/2017   Procedure: COLONOSCOPY;  Surgeon: Carol Ada, MD;  Location: [place];  Service: Endoscopy;  Laterality: N/A;  . HERNIA REPAIR     inguinal left  . LAPAROTOMY N/A 05/24/2017   Procedure: EXPLORATORY LAPAROTOMY AND LOOP ILEOSTOMY;  Surgeon: Greer Pickerel, MD;  Location: [place];  Service: General;  Laterality: N/A;  . PARTIAL COLECTOMY Left 05/12/2017   Procedure: LEFT COLECTOMY;  Surgeon: Judeth Horn, MD;  Location: [place];  Service: General;  Laterality: Left;  . PROCTOSCOPY N/A 05/12/2017   Procedure: PROCTOSCOPY;  Surgeon: Judeth Horn, MD;  Location: [place];  Service:  General;  Laterality: N/A;  . TONSILLECTOMY    . WISDOM TOOTH EXTRACTION      History reviewed. No pertinent family history. Social History:  reports that  has never smoked. he has never used smokeless tobacco. He reports that he drinks about 0.6 oz of alcohol per week. He reports that he does not use drugs.  Allergies: No Known Allergies  Medications Prior to Admission  Medication Sig Dispense Refill  . loperamide (IMODIUM) 2 MG capsule Take 1 capsule (2 mg total) by mouth 2 (two) times daily. 60 capsule 3  . metroNIDAZOLE (FLAGYL) 500 MG tablet Take 1,000 mg by mouth See admin instructions. Take 2 tablets at 1400, 1500 and 2200 day before procedure    . Multiple Vitamin (MULTIVITAMIN WITH MINERALS) TABS tablet Take 1 tablet by mouth daily. 30 tablet 3  . neomycin (MYCIFRADIN) 500 MG tablet Take 1,000 mg by mouth See admin instructions. Take 2 tablets at 1400, 1500 and 2200 day before procedure    . Omega-3 Fatty Acids (FISH OIL) 500 MG CAPS Take 500 mg by mouth daily.    . Probiotic Product (PROBIOTIC DAILY PO) Take 1 capsule by mouth daily.    Marland Kitchen tobramycin (TOBREX) 0.3 % ophthalmic solution INSTILL 1 DROP IN LEFT EYE 4 TIMES A DAY BEGIN 1 DAY PRIOR TO, THE DAY OF, AND 1 DAY AFTER TREATMENT  5  .  Coenzyme Q10 300 MG CAPS Take 300 mg by mouth daily.    . ferrous gluconate (FERGON) 324 MG tablet Take 1 tablet (324 mg total) by mouth 3 (three) times daily with meals. (Patient not taking: Reported on 08/25/2017) 60 tablet 3  . gabapentin (NEURONTIN) 300 MG capsule Take 1 capsule (300 mg total) by mouth 2 (two) times daily. (Patient not taking: Reported on 08/25/2017) 20 capsule 0  . nystatin (MYCOSTATIN/NYSTOP) powder Apply topically 2 (two) times daily. (Patient not taking: Reported on 08/25/2017) 15 g 0  . ondansetron (ZOFRAN) 4 MG tablet Take 1 tablet (4 mg total) by mouth every 8 (eight) hours as needed for nausea. (Patient not taking: Reported on 08/25/2017) 20 tablet 1  . traMADol  (ULTRAM) 50 MG tablet Take 1-2 tablets (50-100 mg total) by mouth every 6 (six) hours as needed for moderate pain. (Patient not taking: Reported on 08/25/2017) 30 tablet 0    No results found for this or any previous visit (from the past 48 hour(s)). No results found.  Review of Systems  Constitutional: Negative.   HENT: Negative.   Eyes: Negative.   Respiratory: Negative.   Cardiovascular: Negative.   Gastrointestinal: Negative.  Negative for abdominal pain.  Genitourinary: Negative.   Musculoskeletal: Negative.   Skin: Negative.   Neurological: Negative.   Endo/Heme/Allergies: Negative.   Psychiatric/Behavioral: Negative.     Blood pressure 114/77, pulse 78, temperature 98.1 F (36.7 C), temperature source Oral, resp. rate 20, SpO2 98 %. Physical Exam  Vitals reviewed. Constitutional: He is oriented to person, place, and time. He appears well-developed and well-nourished.  HENT:  Head: Normocephalic and atraumatic.  Eyes: Conjunctivae and EOM are normal. Pupils are equal, round, and reactive to light.  Neck: Normal range of motion. Neck supple.  Cardiovascular: Normal rate and regular rhythm.  Respiratory: Effort normal and breath sounds normal.  GI: Soft. Normal appearance and bowel sounds are normal. There is no tenderness.    Musculoskeletal: Normal range of motion.  Neurological: He is alert and oriented to person, place, and time. He has normal reflexes.  Skin: Skin is warm and dry.  Psychiatric: He has a normal mood and affect. His behavior is normal. Judgment and thought content normal.     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  Reversal of loop ileostomy.  Rigid sigmoidoscopy in OR.  Judeth Horn, MD 09/07/2017, 6:42 AM

## 2017-09-07 NOTE — Op Note (Signed)
OPERATIVE REPORT  DATE OF OPERATION: 09/07/2017  PATIENT:  Douglas Warren  66 y.o. 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  On proctoscopy lots of stool.  Able to pass the rigid sigmoidoscope up to approximately 18 cm with no evidence of anastomotic problems or disruption.  PROCEDURE:  Procedure(s): REVERSAL OF LOOP ILEOSTOMY RIGID SIGMOIDOSCOPY  SURGEON:  Surgeon(s): Judeth Horn, MD Fanny Skates, MD  ASSISTANT: Dalbert Batman  ANESTHESIA:   general  COMPLICATIONS:  None  EBL: 20 ml  BLOOD ADMINISTERED: none  DRAINS: none and Penrose drain in the Loop ileostomy site.   SPECIMEN:  Source of Specimen:  distal ileostomy bowel  COUNTS CORRECT:  YES  PROCEDURE DETAILS: The patient was taken to the operating room and placed on the table in the supine position.  After an adequate general endotracheal anesthetic was administered, a proper timeout was performed identifying the patient and procedure to be performed.  He subsequently was rolled the patient on his left side in order to perform a rigid sigmoidoscopy.  This is down where a moderate amount of stool was encountered.  We were able to pass the endoscope up to approximately 18 cm with no evidence of anastomotic disruption or problems.  The patient was placed flat on his back and subsequently the 2 openings of the loop ileostomy were closed using running locking suture of 2-0 silk.  She was subsequently prepped and draped in usual sterile manner.  The surgeon and assistant scrubbed and gowned and gloved in usual manner.  The peristomal incision was made using a 10 blade and taken down into the subcutaneous tissue.  We did use electrocautery and Metzenbaum scissors to detach the loop ileostomy from the surrounding subcutaneous tissue and  subsequently the fascia.  We were able to develop a fascial plane circumferentially around the loop ileostomy.  Once this was done we were able to perform an anastomosis between the 2 limbs of the loop ileostomy using a blue cartridge GIA stapler.  This is done through proximal enterotomies with the subsequent merged enterotomy being closed off using a TX 60 stapler.  The anastomosis was then placed back down below the fascial level and irrigated with almost minimal saline at the ileostomy site.  Surgeon and assistant changed gowns and gloves and then came back to close.  The fascia at the ileostomy site was closed using interrupted simple stitches of #1 Novafil.  We irrigated the subcu site and closed the skin with staples on top of 1/4 inch Penrose drain was placed in the subcutaneous tissue.  A sterile dressing was subsequently applied.  All needle counts, sponge counts, and instrument counts were correct.  PATIENT DISPOSITION:  PACU - hemodynamically stable.   Judeth Horn 12/10/20189:03 AM

## 2017-09-08 ENCOUNTER — Encounter (HOSPITAL_COMMUNITY): Payer: Self-pay | Admitting: General Surgery

## 2017-09-08 LAB — CBC
HCT: 37.6 % — ABNORMAL LOW (ref 39.0–52.0)
HEMOGLOBIN: 11.9 g/dL — AB (ref 13.0–17.0)
MCH: 25.8 pg — AB (ref 26.0–34.0)
MCHC: 31.6 g/dL (ref 30.0–36.0)
MCV: 81.4 fL (ref 78.0–100.0)
Platelets: 244 10*3/uL (ref 150–400)
RBC: 4.62 MIL/uL (ref 4.22–5.81)
RDW: 14.3 % (ref 11.5–15.5)
WBC: 9.7 10*3/uL (ref 4.0–10.5)

## 2017-09-08 LAB — BASIC METABOLIC PANEL
ANION GAP: 8 (ref 5–15)
BUN: 7 mg/dL (ref 6–20)
CALCIUM: 8.4 mg/dL — AB (ref 8.9–10.3)
CO2: 21 mmol/L — AB (ref 22–32)
CREATININE: 0.89 mg/dL (ref 0.61–1.24)
Chloride: 108 mmol/L (ref 101–111)
GFR calc Af Amer: >60 mL/min (ref >60)
GFR calc non Af Amer: >60 mL/min (ref >60)
GLUCOSE: 111 mg/dL — AB (ref 65–99)
Potassium: 3.7 mmol/L (ref 3.5–5.1)
Sodium: 137 mmol/L (ref 135–145)

## 2017-09-08 MED ORDER — ACETAMINOPHEN 500 MG PO TABS
1000.0000 mg | ORAL_TABLET | Freq: Four times a day (QID) | ORAL | Status: AC
  Administered 2017-09-08 – 2017-09-09 (×4): 1000 mg via ORAL
  Filled 2017-09-08 (×5): qty 2

## 2017-09-08 MED ORDER — OXYCODONE HCL 5 MG PO TABS
5.0000 mg | ORAL_TABLET | ORAL | Status: DC | PRN
  Administered 2017-09-13: 5 mg via ORAL
  Filled 2017-09-08: qty 1

## 2017-09-08 MED ORDER — METHOCARBAMOL 750 MG PO TABS
750.0000 mg | ORAL_TABLET | Freq: Three times a day (TID) | ORAL | Status: DC
  Administered 2017-09-08 – 2017-09-13 (×8): 750 mg via ORAL
  Filled 2017-09-08 (×11): qty 1

## 2017-09-08 MED ORDER — TRAMADOL HCL 50 MG PO TABS
50.0000 mg | ORAL_TABLET | Freq: Four times a day (QID) | ORAL | Status: DC | PRN
  Administered 2017-09-08 (×2): 50 mg via ORAL
  Administered 2017-09-09 – 2017-09-10 (×2): 100 mg via ORAL
  Filled 2017-09-08: qty 2
  Filled 2017-09-08 (×2): qty 1
  Filled 2017-09-08: qty 2

## 2017-09-08 NOTE — Progress Notes (Signed)
Patient's wife had changed the sheets for him and will help wash up later when he is feeling up for it.

## 2017-09-08 NOTE — Progress Notes (Signed)
GS Progress Note Subjective: Patient went to the toilet this AM, no BM.  Got a little flushed, but is okay now.  Not diaphoretic  Objective: Vital signs in last 24 hours: Temp:  [97.7 F (36.5 C)-98.4 F (36.9 C)] 98.4 F (36.9 C) (12/11 0414) Pulse Rate:  [73-94] 73 (12/11 0414) Resp:  [11-18] 18 (12/11 0414) BP: (106-130)/(73-86) 124/77 (12/11 0414) SpO2:  [95 %-100 %] 98 % (12/11 0414) Weight:  [86.4 kg (190 lb 7.6 oz)] 86.4 kg (190 lb 7.6 oz) (12/10 1214) Last BM Date: 09/06/17  Intake/Output from previous day: 12/10 0701 - 12/11 0700 In: 3922.1 [P.O.:1120; I.V.:2802.1] Out: 2955 [Urine:2930; Blood:25] Intake/Output this shift: No intake/output data recorded.  Lungs: Oxygenating well.  Using IS  Abd: Has some bowel sounds by palpation  Extremities: No clinical signs or symptoms of DVT  Neuro: Intact  Lab Results: CBC  Recent Labs    09/07/17 1217 09/08/17 0535  WBC 10.5 9.7  HGB 12.3* 11.9*  HCT 38.2* 37.6*  PLT 232 244   BMET Recent Labs    09/07/17 1217 09/08/17 0535  NA  --  137  K  --  3.7  CL  --  108  CO2  --  21*  GLUCOSE  --  111*  BUN  --  7  CREATININE 0.96 0.89  CALCIUM  --  8.4*   PT/INR No results for input(s): LABPROT, INR in the last 72 hours. ABG No results for input(s): PHART, HCO3 in the last 72 hours.  Invalid input(s): PCO2, PO2  Studies/Results: No results found.  Anti-infectives: Anti-infectives (From admission, onward)   Start     Dose/Rate Route Frequency Ordered Stop   09/07/17 1930  cefoTEtan (CEFOTAN) 2 g in dextrose 5 % 50 mL IVPB     2 g 100 mL/hr over 30 Minutes Intravenous Every 12 hours 09/07/17 1209 09/07/17 2132   09/07/17 0700  cefoTEtan in Dextrose 5% (CEFOTAN) IVPB 2 g     2 g Intravenous To ShortStay Surgical 09/07/17 0537 09/07/17 0725      Assessment/Plan: s/p Procedure(s): REVERSAL OF LOOP ILEOSTOMY Stay on clear liquids.  Decrease IVFs.  LOS: 1 day    Douglas Warren. Dahlia Bailiff, MD,  FACS [phone] [facility] 09/08/2017

## 2017-09-08 NOTE — Progress Notes (Signed)
Tech went to recheck on patient to see if he would like to go ahead and wash up. Pt said he would like to wait until he takes a good nap and feels up for it. Tech asked if before lunch would be okay with him and he agreed. Tech will be back later to wash up.

## 2017-09-09 ENCOUNTER — Other Ambulatory Visit: Payer: Self-pay

## 2017-09-09 ENCOUNTER — Encounter (HOSPITAL_COMMUNITY): Payer: Self-pay | Admitting: General Practice

## 2017-09-09 NOTE — Progress Notes (Signed)
GS Progress Note Subjective: Patient feeling a lot better this AM after having a lot of expelled flatus.  Became tensely distended yesterday evening, but with the expulsion of gas, feels a lot better this AM.  Also had some diarrhea last evening.  Objective: Vital signs in last 24 hours: Temp:  [97.9 F (36.6 C)-98.5 F (36.9 C)] 98.4 F (36.9 C) (12/12 0610) Pulse Rate:  [72-90] 82 (12/12 0610) Resp:  [17-19] 17 (12/12 0610) BP: (129-157)/(77-99) 135/77 (12/12 0610) SpO2:  [92 %-98 %] 92 % (12/12 0610) Last BM Date: 09/06/17  Intake/Output from previous day: 12/11 0701 - 12/12 0700 In: 2721.7 [P.O.:420; I.V.:2301.7] Out: 1100 [Urine:1100] Intake/Output this shift: No intake/output data recorded.  Lungs: Clear  Abd: Moderately distended, good bowel sounds.  Not tender at all.  Extremities: No clinical signs or symptoms of DVT  Neuro: Intact  Lab Results: CBC  Recent Labs    09/07/17 1217 09/08/17 0535  WBC 10.5 9.7  HGB 12.3* 11.9*  HCT 38.2* 37.6*  PLT 232 244   BMET Recent Labs    09/07/17 1217 09/08/17 0535  NA  --  137  K  --  3.7  CL  --  108  CO2  --  21*  GLUCOSE  --  111*  BUN  --  7  CREATININE 0.96 0.89  CALCIUM  --  8.4*   PT/INR No results for input(s): LABPROT, INR in the last 72 hours. ABG No results for input(s): PHART, HCO3 in the last 72 hours.  Invalid input(s): PCO2, PO2  Studies/Results: No results found.  Anti-infectives: Anti-infectives (From admission, onward)   Start     Dose/Rate Route Frequency Ordered Stop   09/07/17 1930  cefoTEtan (CEFOTAN) 2 g in dextrose 5 % 50 mL IVPB     2 g 100 mL/hr over 30 Minutes Intravenous Every 12 hours 09/07/17 1209 09/07/17 2132   09/07/17 0700  cefoTEtan in Dextrose 5% (CEFOTAN) IVPB 2 g     2 g Intravenous To ShortStay Surgical 09/07/17 0537 09/07/17 0725      Assessment/Plan: s/p Procedure(s): REVERSAL OF LOOP ILEOSTOMY Continue on clear liquids.  Decrease IVFs.  LOS: 2 days     Kathryne Eriksson. Dahlia Bailiff, MD, FACS [phone] [facility] 09/09/2017

## 2017-09-10 LAB — CBC WITH DIFFERENTIAL/PLATELET
Basophils Absolute: 0 10*3/uL (ref 0.0–0.1)
Basophils Relative: 0 %
EOS ABS: 0 10*3/uL (ref 0.0–0.7)
Eosinophils Relative: 1 %
HCT: 37.5 % — ABNORMAL LOW (ref 39.0–52.0)
HEMOGLOBIN: 12 g/dL — AB (ref 13.0–17.0)
LYMPHS ABS: 1.1 10*3/uL (ref 0.7–4.0)
Lymphocytes Relative: 15 %
MCH: 25.8 pg — AB (ref 26.0–34.0)
MCHC: 32 g/dL (ref 30.0–36.0)
MCV: 80.5 fL (ref 78.0–100.0)
Monocytes Absolute: 0.5 10*3/uL (ref 0.1–1.0)
Monocytes Relative: 7 %
NEUTROS PCT: 77 %
Neutro Abs: 6 10*3/uL (ref 1.7–7.7)
Platelets: 222 10*3/uL (ref 150–400)
RBC: 4.66 MIL/uL (ref 4.22–5.81)
RDW: 13.9 % (ref 11.5–15.5)
WBC: 7.7 10*3/uL (ref 4.0–10.5)

## 2017-09-10 LAB — BASIC METABOLIC PANEL
Anion gap: 9 (ref 5–15)
CHLORIDE: 101 mmol/L (ref 101–111)
CO2: 29 mmol/L (ref 22–32)
CREATININE: 0.83 mg/dL (ref 0.61–1.24)
Calcium: 8.8 mg/dL — ABNORMAL LOW (ref 8.9–10.3)
GFR calc non Af Amer: >60 mL/min (ref >60)
Glucose, Bld: 98 mg/dL (ref 65–99)
Potassium: 3.4 mmol/L — ABNORMAL LOW (ref 3.5–5.1)
SODIUM: 139 mmol/L (ref 135–145)

## 2017-09-10 MED ORDER — ACETAMINOPHEN 325 MG PO TABS
650.0000 mg | ORAL_TABLET | Freq: Four times a day (QID) | ORAL | Status: DC | PRN
  Administered 2017-09-10: 650 mg via ORAL
  Filled 2017-09-10 (×2): qty 2

## 2017-09-10 NOTE — Progress Notes (Signed)
GS Progress Note Subjective: Patient doing better this AM.  Had nausea most of yesterday.  Novomiting.  Did not sleep well.  Objective: Vital signs in last 24 hours: Temp:  [98 F (36.7 C)-98.5 F (36.9 C)] 98.5 F (36.9 C) (12/13 0514) Pulse Rate:  [67-85] 85 (12/13 0514) Resp:  [18] 18 (12/13 0514) BP: (127-136)/(80-94) 127/81 (12/13 0514) SpO2:  [96 %-97 %] 96 % (12/13 0514) Last BM Date: 09/06/17  Intake/Output from previous day: 12/12 0701 - 12/13 0700 In: 1693.3 [P.O.:480; I.V.:1213.3] Out: -  Intake/Output this shift: No intake/output data recorded.  Lungs: Clear  Abd: Good bowel sounds.  Wounds look fine.  Extremities: No clinical signs or symptoms of DVT  Neuro: Intact  Lab Results: CBC  Recent Labs    09/07/17 1217 09/08/17 0535  WBC 10.5 9.7  HGB 12.3* 11.9*  HCT 38.2* 37.6*  PLT 232 244   BMET Recent Labs    09/07/17 1217 09/08/17 0535  NA  --  137  K  --  3.7  CL  --  108  CO2  --  21*  GLUCOSE  --  111*  BUN  --  7  CREATININE 0.96 0.89  CALCIUM  --  8.4*   PT/INR No results for input(s): LABPROT, INR in the last 72 hours. ABG No results for input(s): PHART, HCO3 in the last 72 hours.  Invalid input(s): PCO2, PO2  Studies/Results: No results found.  Anti-infectives: Anti-infectives (From admission, onward)   Start     Dose/Rate Route Frequency Ordered Stop   09/07/17 1930  cefoTEtan (CEFOTAN) 2 g in dextrose 5 % 50 mL IVPB     2 g 100 mL/hr over 30 Minutes Intravenous Every 12 hours 09/07/17 1209 09/07/17 2132   09/07/17 0700  cefoTEtan in Dextrose 5% (CEFOTAN) IVPB 2 g     2 g Intravenous To ShortStay Surgical 09/07/17 0537 09/07/17 0725      Assessment/Plan: s/p Procedure(s): REVERSAL OF LOOP ILEOSTOMY Advance diet Decrease IVFs    LOS: 3 days    Kathryne Eriksson. Dahlia Bailiff, MD, FACS [phone] [facility] 09/10/2017

## 2017-09-11 NOTE — Progress Notes (Signed)
Tech asked the patient if he would be up to washing up for the day. The patient said he washed up yesterday and he would like to refrain from doing it at all today. Tech will check back in later to see if he has changed his mind.

## 2017-09-11 NOTE — Plan of Care (Signed)
  Education: Knowledge of General Education information will improve 09/11/2017 0109 - Progressing by Anson Fret, RN Note POC reviewed with pt./wife; pt. ambulating in hall.

## 2017-09-11 NOTE — Care Management Important Message (Signed)
Important Message  Patient Details  Name: Douglas Warren MRN: [id number] Date of Birth: November 06, 1950   Medicare Important Message Given:  Yes    Jeanett Antonopoulos 09/11/2017, 1:51 PM

## 2017-09-11 NOTE — Progress Notes (Signed)
GS Progress Note Subjective: Patient had two small bowel movements yesterday evening, still passing some gas, not as much as before.  Objective: Vital signs in last 24 hours: Temp:  [98.5 F (36.9 C)-98.8 F (37.1 C)] 98.8 F (37.1 C) (12/14 0438) Pulse Rate:  [91-101] 96 (12/14 0438) Resp:  [16-19] 19 (12/14 0438) BP: (130-147)/(78-95) 147/95 (12/14 0438) SpO2:  [96 %-99 %] 96 % (12/14 0438) Last BM Date: 09/06/17  Intake/Output from previous day: 12/13 0701 - 12/14 0700 In: 1180 [P.O.:1180] Out: 1 [Stool:1] Intake/Output this shift: No intake/output data recorded.  Lungs: Clear  Abd: Distended, not tense, good bowel sounds., not tender at all.  Extremities: no clinical signs or symptoms of DVT  Neuro: Intact  Lab Results: CBC  Recent Labs    09/10/17 0706  WBC 7.7  HGB 12.0*  HCT 37.5*  PLT 222   BMET Recent Labs    09/10/17 0706  NA 139  K 3.4*  CL 101  CO2 29  GLUCOSE 98  BUN <5*  CREATININE 0.83  CALCIUM 8.8*   PT/INR No results for input(s): LABPROT, INR in the last 72 hours. ABG No results for input(s): PHART, HCO3 in the last 72 hours.  Invalid input(s): PCO2, PO2  Studies/Results: No results found.  Anti-infectives: Anti-infectives (From admission, onward)   Start     Dose/Rate Route Frequency Ordered Stop   09/07/17 1930  cefoTEtan (CEFOTAN) 2 g in dextrose 5 % 50 mL IVPB     2 g 100 mL/hr over 30 Minutes Intravenous Every 12 hours 09/07/17 1209 09/07/17 2132   09/07/17 0700  cefoTEtan in Dextrose 5% (CEFOTAN) IVPB 2 g     2 g Intravenous To ShortStay Surgical 09/07/17 0537 09/07/17 0725      Assessment/Plan: s/p Procedure(s): REVERSAL OF LOOP ILEOSTOMY Will keep diet at full liquids until abdomen decompresses more.   DC Celebrex.   LOS: 4 days    Kathryne Eriksson. Dahlia Bailiff, MD, FACS [phone] [facility] 09/11/2017

## 2017-09-12 LAB — BASIC METABOLIC PANEL
ANION GAP: 8 (ref 5–15)
BUN: 6 mg/dL (ref 6–20)
CALCIUM: 8.6 mg/dL — AB (ref 8.9–10.3)
CHLORIDE: 100 mmol/L — AB (ref 101–111)
CO2: 30 mmol/L (ref 22–32)
Creatinine, Ser: 0.83 mg/dL (ref 0.61–1.24)
GFR calc Af Amer: >60 mL/min (ref >60)
GFR calc non Af Amer: >60 mL/min (ref >60)
GLUCOSE: 93 mg/dL (ref 65–99)
Potassium: 3.6 mmol/L (ref 3.5–5.1)
Sodium: 138 mmol/L (ref 135–145)

## 2017-09-12 NOTE — Progress Notes (Signed)
5 Days Post-Op  Subjective: Says he feels a lot better.  Passing flatus and loose stools.  He says distention is less still a little distended.  Hasn't taken any narcotics in 3 days. He says he is ready to advance his diet.  Objective: Vital signs in last 24 hours: Temp:  [98.3 F (36.8 C)-98.6 F (37 C)] 98.6 F (37 C) (12/15 0510) Pulse Rate:  [81-89] 81 (12/15 0510) Resp:  [18-20] 18 (12/15 0510) BP: (107-131)/(72-85) 131/85 (12/14 2033) SpO2:  [95 %-98 %] 95 % (12/15 0510) Last BM Date: 09/12/17  Intake/Output from previous day: 12/14 0701 - 12/15 0700 In: 480 [P.O.:480] Out: -  Intake/Output this shift: No intake/output data recorded.  General appearance: alert.  Happy.  Good spirits.  Wife present.  No distress. Resp: clear to auscultation bilaterally GI: soft with active bowel sounds.  Still a little distended and tympanitic.  Wound clean.  Lab Results:  No results found for this or any previous visit (from the past 24 hour(s)).   Studies/Results: No results found.  . enoxaparin (LOVENOX) injection  30 mg Subcutaneous Q24H  . methocarbamol  750 mg Oral TID  . saccharomyces boulardii  250 mg Oral BID     Assessment/Plan: s/p Procedure(s): REVERSAL OF LOOP ILEOSTOMY   POD #5.  Reversal of loop ileostomy. Ileus improving Try soft diet Check b-met  _0 @  LOS: 5 days    Adin Hector 09/12/2017  . .prob

## 2017-09-13 NOTE — Progress Notes (Signed)
D/c home VSS pain 5/10 pain med given.  Dressing clean dry intact.  Instructions given and explained with understanding to pt and wife with understanding

## 2017-09-13 NOTE — Plan of Care (Signed)
Pt resting in bed quietly, no acute issues overnight.

## 2017-09-13 NOTE — Discharge Summary (Signed)
Patient ID: Douglas Warren [id number] 66 y.o. 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  He seemed to be doing very well in the hospital, had had a bowel movement or several, was discharged to home, but was readmitted two days later after discharge with abdominal pain, free air and a likely anastomotic leak  He was taken back to the OR by Dr. Redmond Pulling on August 26 where the patient had extensive adhesions.  The exact site of leakage was not identified, and there was no fecal contamination, no soilage.  It was difficult, but a loop ileostomy was performed.  We are now planning on a reversal of his loop ileostomy after preoperative water-soluble contrast studies demonstrate no evidence of leakage at either site.  He has had some minor wound problems with stitch abscess and poor healing, but currently is doing well.  His wounds are completely healed.    Hospital Course: on the day of admission the patient was taken to the operating room and underwent closure of his loop ileostomy.  This was able to be accomplished through the right lower quadrant incision and was uneventful.     Postoperatively he did well but was slow to resolve his ileus.  Ultimately he did resume bowel function and did well.  On the day of dischargehe had been tolerating a regular diet.  Had had 3 or 4 bowel movements.  He stated that he had no nausea.  Had been ambulating the hall and was voiding without difficulty.  He stated that he had not required any pain medicine for 3 days.  He wanted to go home.   Abdominal exam revealed the abdomen soft and nontender.  Active bowel sounds.  Wounds looked good.  The Penrose drain was removed.     He was given instruction in diet and activities.  No narcotic prescriptions were given since he was not having any pain.  He was asked to return to the office in one week for staple removal.  Consults: None  Significant Diagnostic Studies: lab work  Treatments: surgery: closure of loop ileostomy  Disposition: Home  Patient Instructions:  Allergies as of 09/13/2017   No Known Allergies     Medication List    STOP taking these medications   traMADol 50 MG tablet Commonly known as:  ULTRAM     TAKE these medications   Coenzyme Q10 300 MG Caps Take 300 mg by mouth daily.   ferrous gluconate 324 MG tablet Commonly known as:  FERGON Take 1 tablet (324 mg total) by mouth 3 (three) times daily with meals.   Fish Oil 500 MG Caps Take 500 mg by mouth daily.   gabapentin 300 MG capsule Commonly known as:  NEURONTIN Take 1 capsule (300 mg total) by mouth 2 (two) times daily.   loperamide 2 MG capsule Commonly known as:  IMODIUM Take 1 capsule (2 mg total) by mouth 2 (two) times daily.   metroNIDAZOLE 500 MG tablet Commonly known as:  FLAGYL Take 1,000 mg by mouth See admin instructions. Take 2 tablets at 1400, 1500 and 2200 day before procedure   multivitamin with minerals Tabs  tablet Take 1 tablet by mouth daily.   neomycin 500 MG tablet Commonly known as:  MYCIFRADIN Take 1,000 mg by mouth See admin instructions. Take 2 tablets at 1400, 1500 and 2200 day before procedure   nystatin powder Commonly known as:  MYCOSTATIN/NYSTOP Apply topically 2 (two) times daily.   ondansetron 4 MG tablet Commonly known as:  ZOFRAN Take 1 tablet (4 mg total) by mouth every 8 (eight) hours as needed for nausea.   PROBIOTIC DAILY PO Take 1 capsule by mouth daily.   tobramycin 0.3 % ophthalmic solution Commonly known as:  TOBREX INSTILL 1 DROP IN  LEFT EYE 4 TIMES A DAY BEGIN 1 DAY PRIOR TO, THE DAY OF, AND 1 DAY AFTER TREATMENT            Discharge Care Instructions  (From admission, onward)        Start     Ordered   09/13/17 0000  Discharge wound care:    Comments:  You may remove the bandage, and take a shower every day After taking a shower cover the wound with a small 4 x 4 gauze bandage We will remove the staples in the office at the end of next week   09/13/17 0915      Activity: lots of ambulation.  Okay to shower. Driving for 1 week.  No sports or heavy lifting for one month Diet: low fat, low cholesterol diet Wound Care: as directed  Follow-up:  With Dr. Hulen Skains in 1 week.  Signed: Edsel Petrin. Dalbert Batman, M.D., FACS General and minimally invasive surgery Breast and Colorectal Surgery  09/13/2017, 9:16 AM

## 2017-09-13 NOTE — Discharge Instructions (Signed)
[facility]      [facility] [phone]  OPEN ABDOMINAL SURGERY: POST OP INSTRUCTIONS  Always review your discharge instruction sheet given to you by the facility where your surgery was performed.  IF YOU HAVE DISABILITY OR FAMILY LEAVE FORMS, YOU MUST BRING THEM TO THE OFFICE FOR PROCESSING.  PLEASE DO NOT GIVE THEM TO YOUR DOCTOR.  1. A prescription for pain medication may be given to you upon discharge.  Take your pain medication as prescribed, if needed.  If narcotic pain medicine is not needed, then you may take acetaminophen (Tylenol) or ibuprofen (Advil) as needed. 2. Take your usually prescribed medications unless otherwise directed. 3. If you need a refill on your pain medication, please contact your pharmacy. They will contact our office to request authorization.  Prescriptions will not be filled after 5pm or on week-ends. 4. You should follow a light diet the first few days after arrival home, such as soup and crackers, pudding, etc.unless your doctor has advised otherwise. A high-fiber, low fat diet can be resumed as tolerated.   Be sure to include lots of fluids daily. Most patients will experience some swelling and bruising on the chest and neck area.  Ice packs will help.  Swelling and bruising can take several days to resolve 5. Most patients will experience some swelling and bruising in the area of the incision. Ice pack will help. Swelling and bruising can take several days to resolve..  6. It is common to experience some constipation if taking pain medication after surgery.  Increasing fluid intake and taking a stool softener will usually help or prevent this problem from occurring.  A mild laxative (Milk of Magnesia or Miralax) should be taken according to package directions if there are no bowel movements after 48 hours. 7.  You may have steri-strips (small skin tapes) in place directly over the incision.  These strips should be left on the skin for 7-10 days.  If your  surgeon used skin glue on the incision, you may shower in 24 hours.  The glue will flake off over the next 2-3 weeks.  Any sutures or staples will be removed at the office during your follow-up visit. You may find that a light gauze bandage over your incision may keep your staples from being rubbed or pulled. You may shower and replace the bandage daily. 8. ACTIVITIES:  You may resume regular (light) daily activities beginning the next day--such as daily self-care, walking, climbing stairs--gradually increasing activities as tolerated.  You may have sexual intercourse when it is comfortable.  Refrain from any heavy lifting or straining until approved by your doctor. a. You may drive when you no longer are taking prescription pain medication, you can comfortably wear a seatbelt, and you can safely maneuver your car and apply brakes b. Return to Work: ___________________________________ 9. You should see your doctor in the office for a follow-up appointment approximately two weeks after your surgery.  Make sure that you call for this appointment within a day or two after you arrive home to insure a convenient appointment time. OTHER INSTRUCTIONS:  _____________________________________________________________ _____________________________________________________________  WHEN TO CALL YOUR DOCTOR: 1. Fever over 101.0 2. Inability to urinate 3. Nausea and/or vomiting 4. Extreme swelling or bruising 5. Continued bleeding from incision. 6. Increased pain, redness, or drainage from the incision. 7. Difficulty swallowing or breathing 8. Muscle cramping or spasms. 9. Numbness or tingling in hands or feet or around lips.  The clinic staff is available to   answer your questions during regular business hours.  Please don't hesitate to call and ask to speak to one of the nurses if you have concerns.  For further questions, please visit [email or web address]   

## 2017-10-07 DIAGNOSIS — H34812 Central retinal vein occlusion, left eye, with macular edema: Secondary | ICD-10-CM | POA: Diagnosis not present

## 2017-10-07 DIAGNOSIS — H43813 Vitreous degeneration, bilateral: Secondary | ICD-10-CM | POA: Diagnosis not present

## 2017-10-07 DIAGNOSIS — H35372 Puckering of macula, left eye: Secondary | ICD-10-CM | POA: Diagnosis not present

## 2017-10-07 DIAGNOSIS — H3562 Retinal hemorrhage, left eye: Secondary | ICD-10-CM | POA: Diagnosis not present

## 2018-01-01 DIAGNOSIS — H3540 Unspecified peripheral retinal degeneration: Secondary | ICD-10-CM | POA: Diagnosis not present

## 2018-01-01 DIAGNOSIS — H34812 Central retinal vein occlusion, left eye, with macular edema: Secondary | ICD-10-CM | POA: Diagnosis not present

## 2018-01-01 DIAGNOSIS — H43813 Vitreous degeneration, bilateral: Secondary | ICD-10-CM | POA: Diagnosis not present

## 2018-01-01 DIAGNOSIS — H2513 Age-related nuclear cataract, bilateral: Secondary | ICD-10-CM | POA: Diagnosis not present

## 2018-02-19 DIAGNOSIS — H35372 Puckering of macula, left eye: Secondary | ICD-10-CM | POA: Diagnosis not present

## 2018-02-19 DIAGNOSIS — H3562 Retinal hemorrhage, left eye: Secondary | ICD-10-CM | POA: Diagnosis not present

## 2018-02-19 DIAGNOSIS — H34812 Central retinal vein occlusion, left eye, with macular edema: Secondary | ICD-10-CM | POA: Diagnosis not present

## 2018-02-19 DIAGNOSIS — H3540 Unspecified peripheral retinal degeneration: Secondary | ICD-10-CM | POA: Diagnosis not present

## 2018-06-16 DIAGNOSIS — Z79899 Other long term (current) drug therapy: Secondary | ICD-10-CM | POA: Diagnosis not present

## 2018-06-16 DIAGNOSIS — R05 Cough: Secondary | ICD-10-CM | POA: Diagnosis not present

## 2018-06-16 DIAGNOSIS — B9789 Other viral agents as the cause of diseases classified elsewhere: Secondary | ICD-10-CM | POA: Diagnosis not present

## 2018-06-16 DIAGNOSIS — Z85038 Personal history of other malignant neoplasm of large intestine: Secondary | ICD-10-CM | POA: Diagnosis not present

## 2018-06-16 DIAGNOSIS — J069 Acute upper respiratory infection, unspecified: Secondary | ICD-10-CM | POA: Diagnosis not present

## 2018-06-22 DIAGNOSIS — Z85038 Personal history of other malignant neoplasm of large intestine: Secondary | ICD-10-CM | POA: Diagnosis not present

## 2018-06-22 DIAGNOSIS — Z800 Family history of malignant neoplasm of digestive organs: Secondary | ICD-10-CM | POA: Diagnosis not present

## 2018-06-29 DIAGNOSIS — Z85038 Personal history of other malignant neoplasm of large intestine: Secondary | ICD-10-CM | POA: Diagnosis not present

## 2018-06-29 DIAGNOSIS — Z800 Family history of malignant neoplasm of digestive organs: Secondary | ICD-10-CM | POA: Diagnosis not present

## 2018-06-29 DIAGNOSIS — Z86010 Personal history of colonic polyps: Secondary | ICD-10-CM | POA: Diagnosis not present

## 2018-06-29 DIAGNOSIS — D122 Benign neoplasm of ascending colon: Secondary | ICD-10-CM | POA: Diagnosis not present

## 2018-06-29 DIAGNOSIS — K649 Unspecified hemorrhoids: Secondary | ICD-10-CM | POA: Diagnosis not present

## 2018-06-29 DIAGNOSIS — Z980 Intestinal bypass and anastomosis status: Secondary | ICD-10-CM | POA: Diagnosis not present

## 2018-07-09 DIAGNOSIS — H3562 Retinal hemorrhage, left eye: Secondary | ICD-10-CM | POA: Diagnosis not present

## 2018-07-09 DIAGNOSIS — H35372 Puckering of macula, left eye: Secondary | ICD-10-CM | POA: Diagnosis not present

## 2018-07-09 DIAGNOSIS — H43812 Vitreous degeneration, left eye: Secondary | ICD-10-CM | POA: Diagnosis not present

## 2018-07-09 DIAGNOSIS — H34812 Central retinal vein occlusion, left eye, with macular edema: Secondary | ICD-10-CM | POA: Diagnosis not present

## 2018-07-28 IMAGING — CT CT ABD-PELV W/ CM
2 of 5 series · 15 of 46 positions shown, 17 images · IV contrast (iopamidol)
Comparison: Radiograph 05/16/2017

CLINICAL DATA: Abdominal pain. Right upper quadrant pain and fever.
Left hemicolectomy 12 days prior.

EXAM:
CT ABDOMEN AND PELVIS WITH CONTRAST
TECHNIQUE: Multidetector CT imaging of the abdomen and pelvis was performed
using the standard protocol following bolus administration of
intravenous contrast.
CONTRAST:  100mL [id number] IOPAMIDOL ([id number]) INJECTION 61%

[Series 3: a/p w/ 5mm · axial · 0.87mm/px · z∈[+936,+1386]mm · 12 of 104 slices shown, 14 images]
[im 7/104  soft-tissue]
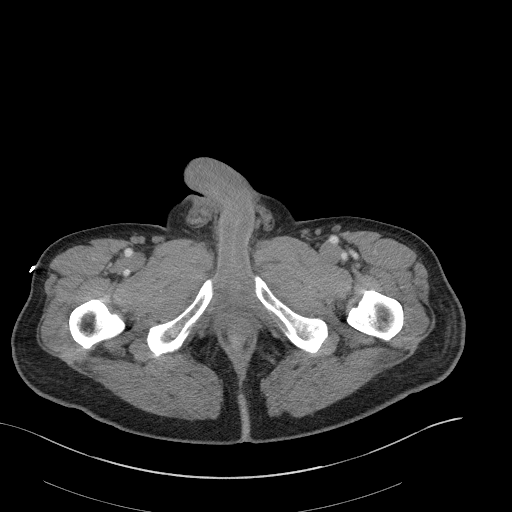
[im 7/104  bone]
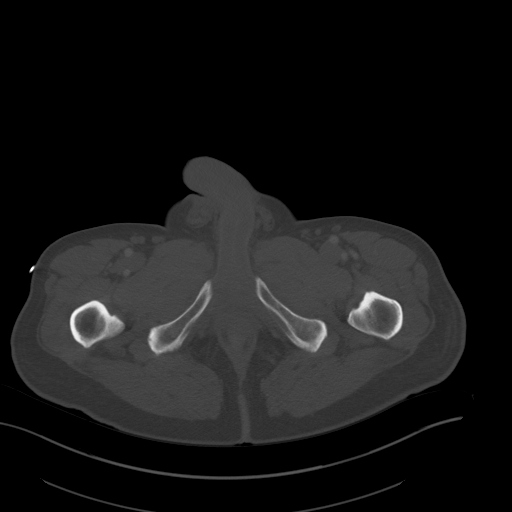
[im 19/104  soft-tissue]
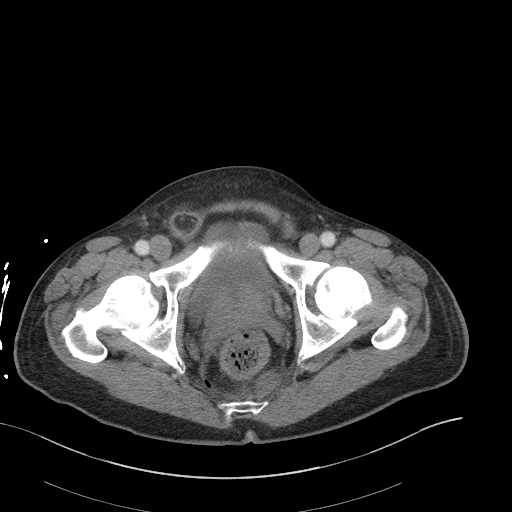
[im 25/104  soft-tissue]
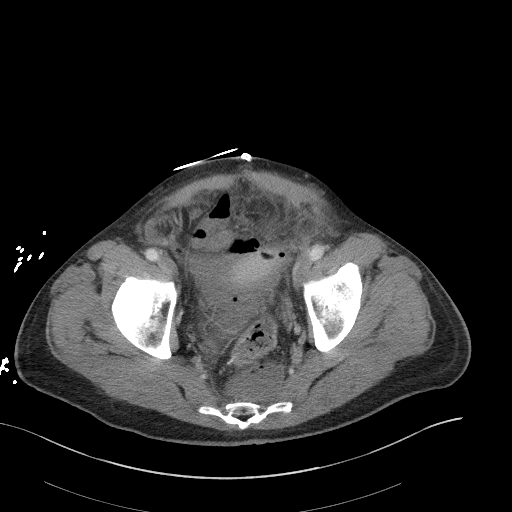
[im 31/104  soft-tissue]
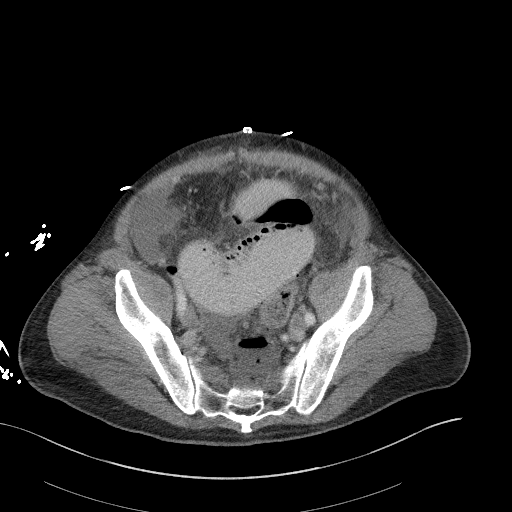
[im 43/104  soft-tissue]
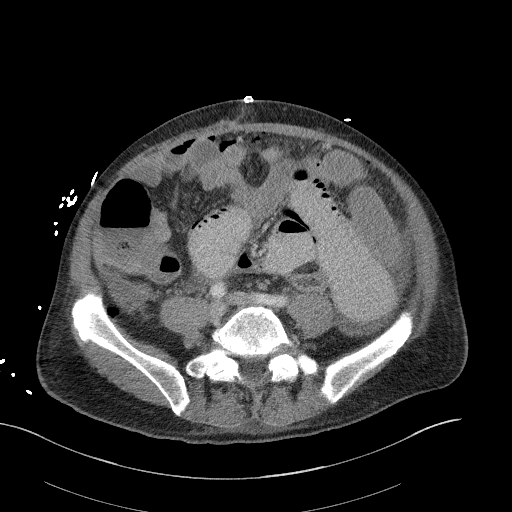
[im 49/104  soft-tissue]
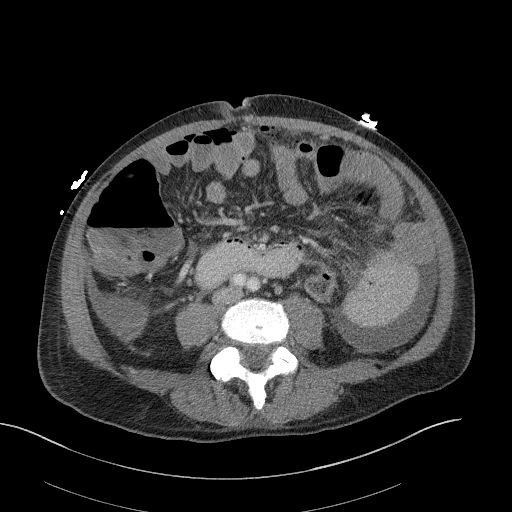
[im 55/104  soft-tissue]
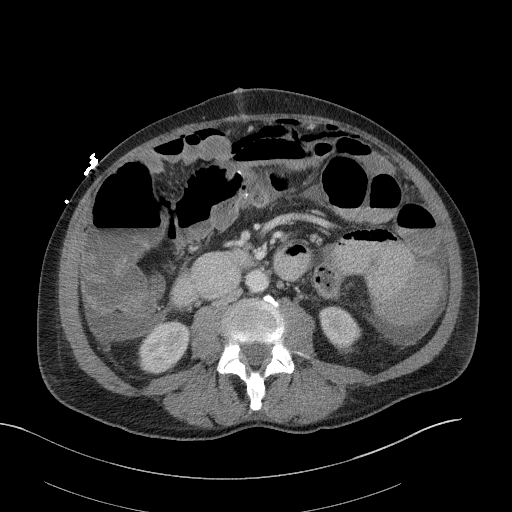
[im 67/104  soft-tissue]
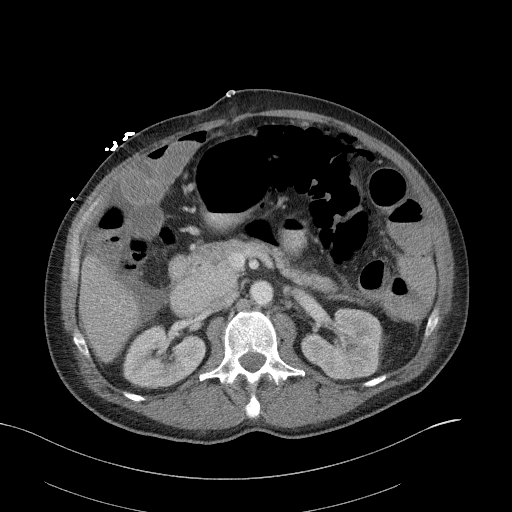
[im 73/104  soft-tissue]
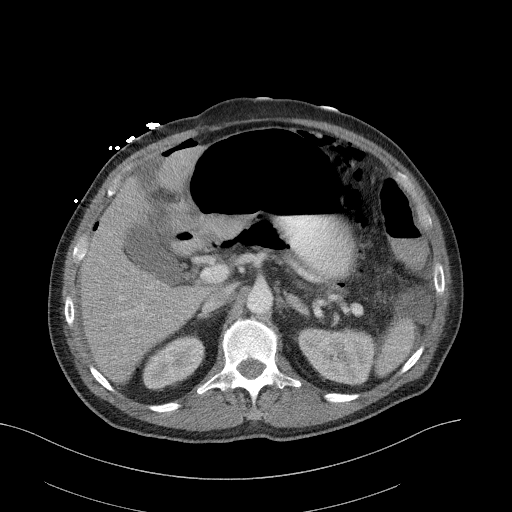
[im 73/104  bone]
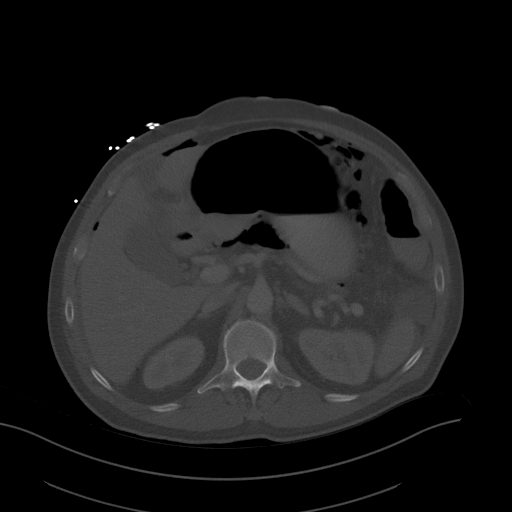
[im 79/104  soft-tissue]
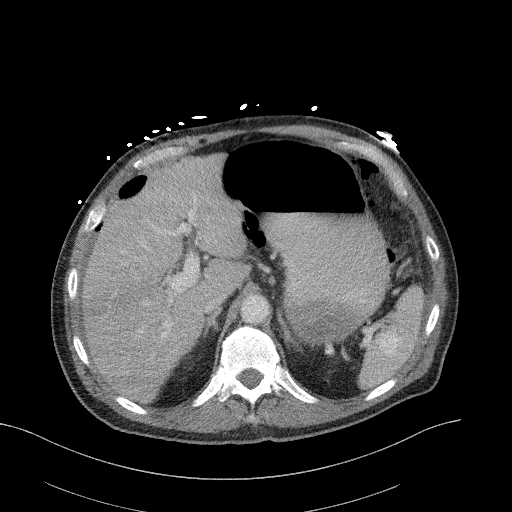
[im 91/104  soft-tissue]
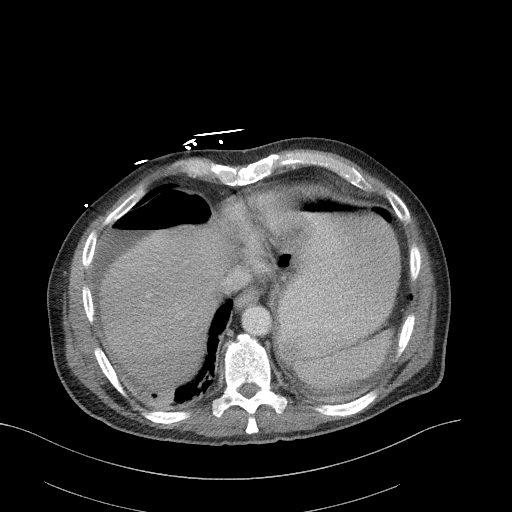
[im 97/104  soft-tissue]
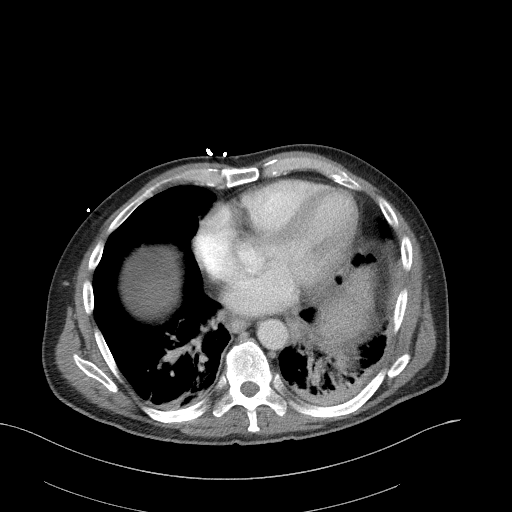

[Series 6: a/p w/ cor · coronal · 0.87mm/px · 3 of 151 slices shown]
[im 51/151  soft-tissue]
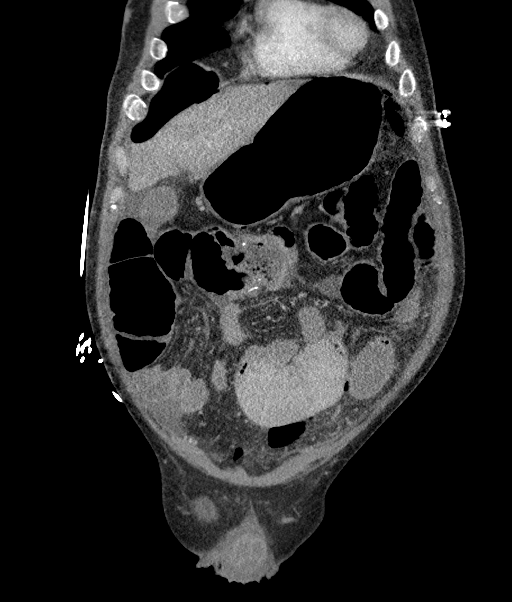
[im 67/151  soft-tissue]
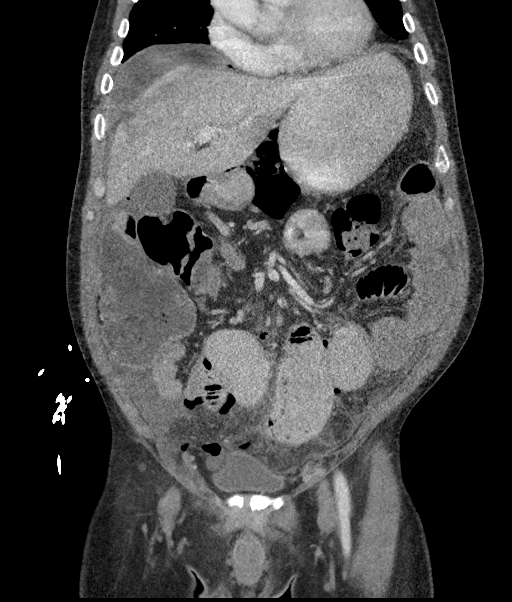
[im 84/151  soft-tissue]
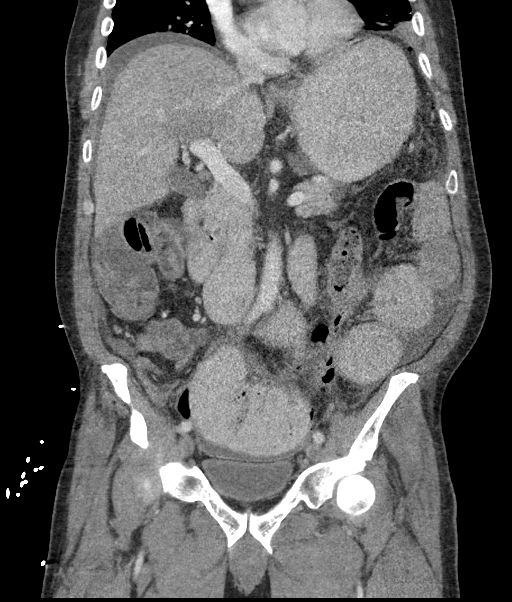

[15 of 46 positions shown; findings below may reference images not displayed]

FINDINGS: Lower chest: Lung base consolidations in the right and left lower
lobe and lingula. Small left pleural effusion.

Hepatobiliary: No focal liver abnormality is seen. No gallstones or
biliary dilatation. Fluid adjacent to the gallbladder limits
assessment for gallbladder wall thickening.

Pancreas: No ductal dilatation or inflammation.

Spleen: Normal in size without focal abnormality.

Adrenals/Urinary Tract: Normal adrenal glands. No hydronephrosis or
perinephric edema. Symmetric excretion on delayed phase imaging.
Urinary bladder is physiologically distended.

Stomach/Bowel: Large volume of free air in the abdomen and pelvis
consistent with bowel perforation. Extraluminal heterogeneous with
5.9 x 3.1 cm fluid collection in the deep pelvis adjacent to the
sigmoid anastomotic suture line. The stomach is distended. Proximal
small bowel loops are distended and filled with fluid and enteric
contrast. Enteric sutures in the sigmoid colon. Enteric sutures in
the transverse colon. Descending colon is fluid-filled with mild
wall thickening. No extraluminal enteric contrast.

Vascular/Lymphatic: Mild aortic atherosclerosis. No aneurysm. Portal
and mesenteric veins are patent without portal venous gas. No
evidence of adenopathy.

Reproductive: Prostate gland is enlarged spanning 6.8 cm.

Other: Moderate to large volume of free air in the abdomen, pelvis
and throughout the mesentery. This is unexpected 12 days post recent
abdominal surgery. Moderate free fluid prominently in the pelvis.
Small pericolonic abscess in the presacral region adjacent to the
sigmoid enteric sutures 5.9 x 3.1 cm. There is mesenteric and
omental edema in the anterior lower abdomen. Midline skin staples.
Fluid and edema track into the right inguinal canal.

Musculoskeletal: There are no acute or suspicious osseous
abnormalities.
IMPRESSION: 1. Moderate-large volume free intra-abdominal air, scattered
throughout the abdomen and pelvis. Suspect site of perforation is at
the a sigmoid anastomosis as there is an adjacent presacral abscess
measuring 5.8 x 3.1 cm.
2. Probable reactive ileus with dilated fluid in contrast filled
small bowel.
Critical Value/emergent results were called by telephone at the time
of interpretation on 05/24/2017 at [DATE] to Dr. RATURI ELVIR , who
verbally acknowledged these results.

## 2018-08-13 DIAGNOSIS — H35372 Puckering of macula, left eye: Secondary | ICD-10-CM | POA: Diagnosis not present

## 2018-08-13 DIAGNOSIS — H3562 Retinal hemorrhage, left eye: Secondary | ICD-10-CM | POA: Diagnosis not present

## 2018-08-13 DIAGNOSIS — H3540 Unspecified peripheral retinal degeneration: Secondary | ICD-10-CM | POA: Diagnosis not present

## 2018-08-13 DIAGNOSIS — H34812 Central retinal vein occlusion, left eye, with macular edema: Secondary | ICD-10-CM | POA: Diagnosis not present

## 2018-11-05 DIAGNOSIS — R739 Hyperglycemia, unspecified: Secondary | ICD-10-CM | POA: Diagnosis not present

## 2018-11-05 DIAGNOSIS — Z79899 Other long term (current) drug therapy: Secondary | ICD-10-CM | POA: Diagnosis not present

## 2018-11-05 DIAGNOSIS — Z13220 Encounter for screening for lipoid disorders: Secondary | ICD-10-CM | POA: Diagnosis not present

## 2018-11-05 DIAGNOSIS — Z1159 Encounter for screening for other viral diseases: Secondary | ICD-10-CM | POA: Diagnosis not present

## 2018-11-05 DIAGNOSIS — Z125 Encounter for screening for malignant neoplasm of prostate: Secondary | ICD-10-CM | POA: Diagnosis not present

## 2018-11-05 DIAGNOSIS — I700 Atherosclerosis of aorta: Secondary | ICD-10-CM | POA: Diagnosis not present

## 2018-11-05 DIAGNOSIS — R002 Palpitations: Secondary | ICD-10-CM | POA: Diagnosis not present

## 2018-11-05 DIAGNOSIS — N528 Other male erectile dysfunction: Secondary | ICD-10-CM | POA: Diagnosis not present

## 2018-11-05 DIAGNOSIS — Z9189 Other specified personal risk factors, not elsewhere classified: Secondary | ICD-10-CM | POA: Diagnosis not present

## 2018-11-05 DIAGNOSIS — D509 Iron deficiency anemia, unspecified: Secondary | ICD-10-CM | POA: Diagnosis not present

## 2018-11-24 DIAGNOSIS — H3540 Unspecified peripheral retinal degeneration: Secondary | ICD-10-CM | POA: Diagnosis not present

## 2018-11-24 DIAGNOSIS — H34812 Central retinal vein occlusion, left eye, with macular edema: Secondary | ICD-10-CM | POA: Diagnosis not present

## 2018-11-24 DIAGNOSIS — H35372 Puckering of macula, left eye: Secondary | ICD-10-CM | POA: Diagnosis not present

## 2018-11-24 DIAGNOSIS — H3562 Retinal hemorrhage, left eye: Secondary | ICD-10-CM | POA: Diagnosis not present

## 2018-12-14 DIAGNOSIS — R002 Palpitations: Secondary | ICD-10-CM | POA: Diagnosis not present

## 2018-12-14 DIAGNOSIS — R011 Cardiac murmur, unspecified: Secondary | ICD-10-CM | POA: Diagnosis not present

## 2018-12-14 DIAGNOSIS — Z85038 Personal history of other malignant neoplasm of large intestine: Secondary | ICD-10-CM | POA: Diagnosis not present

## 2018-12-17 DIAGNOSIS — I878 Other specified disorders of veins: Secondary | ICD-10-CM | POA: Diagnosis not present

## 2018-12-17 DIAGNOSIS — Z0000 Encounter for general adult medical examination without abnormal findings: Secondary | ICD-10-CM | POA: Diagnosis not present

## 2018-12-17 DIAGNOSIS — Z2821 Immunization not carried out because of patient refusal: Secondary | ICD-10-CM | POA: Diagnosis not present

## 2018-12-17 DIAGNOSIS — Z9189 Other specified personal risk factors, not elsewhere classified: Secondary | ICD-10-CM | POA: Diagnosis not present

## 2018-12-17 DIAGNOSIS — I071 Rheumatic tricuspid insufficiency: Secondary | ICD-10-CM | POA: Diagnosis not present

## 2018-12-17 DIAGNOSIS — R002 Palpitations: Secondary | ICD-10-CM | POA: Diagnosis not present

## 2018-12-17 DIAGNOSIS — Z23 Encounter for immunization: Secondary | ICD-10-CM | POA: Diagnosis not present

## 2018-12-27 DIAGNOSIS — I493 Ventricular premature depolarization: Secondary | ICD-10-CM | POA: Diagnosis not present

## 2018-12-27 DIAGNOSIS — I471 Supraventricular tachycardia: Secondary | ICD-10-CM | POA: Diagnosis not present

## 2018-12-27 DIAGNOSIS — R000 Tachycardia, unspecified: Secondary | ICD-10-CM | POA: Diagnosis not present

## 2018-12-27 DIAGNOSIS — I491 Atrial premature depolarization: Secondary | ICD-10-CM | POA: Diagnosis not present

## 2018-12-27 DIAGNOSIS — I499 Cardiac arrhythmia, unspecified: Secondary | ICD-10-CM | POA: Diagnosis not present

## 2019-01-05 DIAGNOSIS — H34812 Central retinal vein occlusion, left eye, with macular edema: Secondary | ICD-10-CM | POA: Diagnosis not present

## 2019-01-17 DIAGNOSIS — R972 Elevated prostate specific antigen [PSA]: Secondary | ICD-10-CM | POA: Diagnosis not present

## 2019-02-04 DIAGNOSIS — Z85038 Personal history of other malignant neoplasm of large intestine: Secondary | ICD-10-CM | POA: Diagnosis not present

## 2019-02-04 DIAGNOSIS — R002 Palpitations: Secondary | ICD-10-CM | POA: Diagnosis not present

## 2019-02-04 DIAGNOSIS — R9430 Abnormal result of cardiovascular function study, unspecified: Secondary | ICD-10-CM | POA: Diagnosis not present

## 2019-02-18 DIAGNOSIS — H34812 Central retinal vein occlusion, left eye, with macular edema: Secondary | ICD-10-CM | POA: Diagnosis not present

## 2019-03-11 DIAGNOSIS — R002 Palpitations: Secondary | ICD-10-CM | POA: Diagnosis not present

## 2019-03-11 DIAGNOSIS — Z9189 Other specified personal risk factors, not elsewhere classified: Secondary | ICD-10-CM | POA: Diagnosis not present

## 2019-03-11 DIAGNOSIS — E7800 Pure hypercholesterolemia, unspecified: Secondary | ICD-10-CM | POA: Diagnosis not present

## 2019-03-11 DIAGNOSIS — R9430 Abnormal result of cardiovascular function study, unspecified: Secondary | ICD-10-CM | POA: Diagnosis not present

## 2019-03-11 DIAGNOSIS — Z85038 Personal history of other malignant neoplasm of large intestine: Secondary | ICD-10-CM | POA: Diagnosis not present

## 2019-03-11 DIAGNOSIS — Z800 Family history of malignant neoplasm of digestive organs: Secondary | ICD-10-CM | POA: Diagnosis not present

## 2019-03-11 DIAGNOSIS — I700 Atherosclerosis of aorta: Secondary | ICD-10-CM | POA: Diagnosis not present

## 2019-03-15 DIAGNOSIS — I700 Atherosclerosis of aorta: Secondary | ICD-10-CM | POA: Diagnosis not present

## 2019-03-15 DIAGNOSIS — E785 Hyperlipidemia, unspecified: Secondary | ICD-10-CM | POA: Diagnosis not present

## 2019-03-15 DIAGNOSIS — R002 Palpitations: Secondary | ICD-10-CM | POA: Diagnosis not present

## 2019-03-18 DIAGNOSIS — Z125 Encounter for screening for malignant neoplasm of prostate: Secondary | ICD-10-CM | POA: Diagnosis not present

## 2019-03-18 DIAGNOSIS — Z9189 Other specified personal risk factors, not elsewhere classified: Secondary | ICD-10-CM | POA: Diagnosis not present

## 2019-03-18 DIAGNOSIS — R972 Elevated prostate specific antigen [PSA]: Secondary | ICD-10-CM | POA: Diagnosis not present

## 2019-03-18 DIAGNOSIS — E7849 Other hyperlipidemia: Secondary | ICD-10-CM | POA: Diagnosis not present

## 2019-03-18 DIAGNOSIS — Z79899 Other long term (current) drug therapy: Secondary | ICD-10-CM | POA: Diagnosis not present

## 2019-03-21 DIAGNOSIS — B079 Viral wart, unspecified: Secondary | ICD-10-CM | POA: Diagnosis not present

## 2019-03-21 DIAGNOSIS — L720 Epidermal cyst: Secondary | ICD-10-CM | POA: Diagnosis not present

## 2019-03-21 DIAGNOSIS — C44319 Basal cell carcinoma of skin of other parts of face: Secondary | ICD-10-CM | POA: Diagnosis not present

## 2019-03-21 DIAGNOSIS — C44311 Basal cell carcinoma of skin of nose: Secondary | ICD-10-CM | POA: Diagnosis not present

## 2019-03-22 DIAGNOSIS — G4719 Other hypersomnia: Secondary | ICD-10-CM | POA: Diagnosis not present

## 2019-03-22 DIAGNOSIS — R0683 Snoring: Secondary | ICD-10-CM | POA: Diagnosis not present

## 2019-03-25 DIAGNOSIS — R0683 Snoring: Secondary | ICD-10-CM | POA: Diagnosis not present

## 2019-03-25 DIAGNOSIS — H35372 Puckering of macula, left eye: Secondary | ICD-10-CM | POA: Diagnosis not present

## 2019-03-25 DIAGNOSIS — H3540 Unspecified peripheral retinal degeneration: Secondary | ICD-10-CM | POA: Diagnosis not present

## 2019-03-25 DIAGNOSIS — H34812 Central retinal vein occlusion, left eye, with macular edema: Secondary | ICD-10-CM | POA: Diagnosis not present

## 2019-03-25 DIAGNOSIS — H43812 Vitreous degeneration, left eye: Secondary | ICD-10-CM | POA: Diagnosis not present

## 2019-03-25 DIAGNOSIS — G4719 Other hypersomnia: Secondary | ICD-10-CM | POA: Diagnosis not present

## 2019-03-29 DIAGNOSIS — M7741 Metatarsalgia, right foot: Secondary | ICD-10-CM | POA: Diagnosis not present

## 2019-03-29 DIAGNOSIS — L909 Atrophic disorder of skin, unspecified: Secondary | ICD-10-CM | POA: Diagnosis not present

## 2019-03-29 DIAGNOSIS — B070 Plantar wart: Secondary | ICD-10-CM | POA: Diagnosis not present

## 2019-03-29 DIAGNOSIS — L851 Acquired keratosis [keratoderma] palmaris et plantaris: Secondary | ICD-10-CM | POA: Diagnosis not present

## 2019-03-30 DIAGNOSIS — R972 Elevated prostate specific antigen [PSA]: Secondary | ICD-10-CM | POA: Diagnosis not present

## 2019-04-29 DIAGNOSIS — G4719 Other hypersomnia: Secondary | ICD-10-CM | POA: Diagnosis not present

## 2019-04-29 DIAGNOSIS — G4733 Obstructive sleep apnea (adult) (pediatric): Secondary | ICD-10-CM | POA: Diagnosis not present

## 2019-05-03 DIAGNOSIS — L851 Acquired keratosis [keratoderma] palmaris et plantaris: Secondary | ICD-10-CM | POA: Diagnosis not present

## 2019-05-03 DIAGNOSIS — M7741 Metatarsalgia, right foot: Secondary | ICD-10-CM | POA: Diagnosis not present

## 2019-05-03 DIAGNOSIS — L909 Atrophic disorder of skin, unspecified: Secondary | ICD-10-CM | POA: Diagnosis not present

## 2019-05-06 DIAGNOSIS — H3540 Unspecified peripheral retinal degeneration: Secondary | ICD-10-CM | POA: Diagnosis not present

## 2019-05-06 DIAGNOSIS — H43813 Vitreous degeneration, bilateral: Secondary | ICD-10-CM | POA: Diagnosis not present

## 2019-05-06 DIAGNOSIS — H35372 Puckering of macula, left eye: Secondary | ICD-10-CM | POA: Diagnosis not present

## 2019-05-06 DIAGNOSIS — H34812 Central retinal vein occlusion, left eye, with macular edema: Secondary | ICD-10-CM | POA: Diagnosis not present

## 2019-05-24 DIAGNOSIS — R972 Elevated prostate specific antigen [PSA]: Secondary | ICD-10-CM | POA: Diagnosis not present

## 2019-06-10 DIAGNOSIS — G4733 Obstructive sleep apnea (adult) (pediatric): Secondary | ICD-10-CM | POA: Diagnosis not present

## 2019-06-15 DIAGNOSIS — H524 Presbyopia: Secondary | ICD-10-CM | POA: Diagnosis not present

## 2019-06-15 DIAGNOSIS — H52223 Regular astigmatism, bilateral: Secondary | ICD-10-CM | POA: Diagnosis not present

## 2019-06-15 DIAGNOSIS — H5203 Hypermetropia, bilateral: Secondary | ICD-10-CM | POA: Diagnosis not present

## 2019-06-15 DIAGNOSIS — H35372 Puckering of macula, left eye: Secondary | ICD-10-CM | POA: Diagnosis not present

## 2019-06-15 DIAGNOSIS — H35352 Cystoid macular degeneration, left eye: Secondary | ICD-10-CM | POA: Diagnosis not present

## 2019-06-15 DIAGNOSIS — H35033 Hypertensive retinopathy, bilateral: Secondary | ICD-10-CM | POA: Diagnosis not present

## 2019-06-15 DIAGNOSIS — H34812 Central retinal vein occlusion, left eye, with macular edema: Secondary | ICD-10-CM | POA: Diagnosis not present

## 2019-06-15 DIAGNOSIS — D3132 Benign neoplasm of left choroid: Secondary | ICD-10-CM | POA: Diagnosis not present

## 2019-06-15 DIAGNOSIS — H43812 Vitreous degeneration, left eye: Secondary | ICD-10-CM | POA: Diagnosis not present

## 2019-06-15 DIAGNOSIS — H2513 Age-related nuclear cataract, bilateral: Secondary | ICD-10-CM | POA: Diagnosis not present

## 2019-06-17 DIAGNOSIS — G4719 Other hypersomnia: Secondary | ICD-10-CM | POA: Diagnosis not present

## 2019-06-17 DIAGNOSIS — G4733 Obstructive sleep apnea (adult) (pediatric): Secondary | ICD-10-CM | POA: Diagnosis not present

## 2019-06-17 DIAGNOSIS — R0683 Snoring: Secondary | ICD-10-CM | POA: Diagnosis not present

## 2019-06-28 DIAGNOSIS — G4733 Obstructive sleep apnea (adult) (pediatric): Secondary | ICD-10-CM | POA: Diagnosis not present

## 2019-07-21 DIAGNOSIS — Z20828 Contact with and (suspected) exposure to other viral communicable diseases: Secondary | ICD-10-CM | POA: Diagnosis not present

## 2019-08-03 DIAGNOSIS — D3132 Benign neoplasm of left choroid: Secondary | ICD-10-CM | POA: Diagnosis not present

## 2019-08-03 DIAGNOSIS — H35352 Cystoid macular degeneration, left eye: Secondary | ICD-10-CM | POA: Diagnosis not present

## 2019-08-03 DIAGNOSIS — H34812 Central retinal vein occlusion, left eye, with macular edema: Secondary | ICD-10-CM | POA: Diagnosis not present

## 2019-09-06 DIAGNOSIS — D122 Benign neoplasm of ascending colon: Secondary | ICD-10-CM | POA: Diagnosis not present

## 2019-09-06 DIAGNOSIS — Z85038 Personal history of other malignant neoplasm of large intestine: Secondary | ICD-10-CM | POA: Diagnosis not present

## 2019-09-06 DIAGNOSIS — Z86010 Personal history of colonic polyps: Secondary | ICD-10-CM | POA: Diagnosis not present

## 2019-09-06 DIAGNOSIS — D123 Benign neoplasm of transverse colon: Secondary | ICD-10-CM | POA: Diagnosis not present
